# Patient Record
Sex: Male | Born: 1940 | Race: White | Hispanic: No | Smoking: Current every day smoker
Health system: Southern US, Community
[De-identification: ages and names within clinical notes are randomized; demographics above are authoritative.]

## PROBLEM LIST (undated history)

## (undated) DIAGNOSIS — D110 Benign neoplasm of parotid gland: Secondary | ICD-10-CM

## (undated) DIAGNOSIS — IMO0001 Reserved for inherently not codable concepts without codable children: Secondary | ICD-10-CM

## (undated) DIAGNOSIS — I219 Acute myocardial infarction, unspecified: Secondary | ICD-10-CM

## (undated) DIAGNOSIS — I2510 Atherosclerotic heart disease of native coronary artery without angina pectoris: Secondary | ICD-10-CM

## (undated) DIAGNOSIS — R911 Solitary pulmonary nodule: Secondary | ICD-10-CM

## (undated) DIAGNOSIS — F17200 Nicotine dependence, unspecified, uncomplicated: Secondary | ICD-10-CM

## (undated) DIAGNOSIS — J449 Chronic obstructive pulmonary disease, unspecified: Secondary | ICD-10-CM

## (undated) DIAGNOSIS — E785 Hyperlipidemia, unspecified: Secondary | ICD-10-CM

## (undated) DIAGNOSIS — C801 Malignant (primary) neoplasm, unspecified: Secondary | ICD-10-CM

## (undated) DIAGNOSIS — I10 Essential (primary) hypertension: Secondary | ICD-10-CM

## (undated) HISTORY — DX: Benign neoplasm of parotid gland: D11.0

## (undated) HISTORY — DX: Acute myocardial infarction, unspecified: I21.9

## (undated) HISTORY — DX: Solitary pulmonary nodule: R91.1

## (undated) HISTORY — DX: Hyperlipidemia, unspecified: E78.5

## (undated) HISTORY — PX: CARDIAC CATHETERIZATION: SHX172

## (undated) HISTORY — PX: OTHER SURGICAL HISTORY: SHX169

## (undated) HISTORY — DX: Essential (primary) hypertension: I10

## (undated) HISTORY — DX: Nicotine dependence, unspecified, uncomplicated: F17.200

## (undated) HISTORY — PX: TONSILLECTOMY: SUR1361

---

## 2012-05-12 ENCOUNTER — Encounter: Payer: Self-pay | Admitting: Family Medicine

## 2012-05-12 ENCOUNTER — Ambulatory Visit (INDEPENDENT_AMBULATORY_CARE_PROVIDER_SITE_OTHER): Payer: Medicare Other | Admitting: Family Medicine

## 2012-05-12 VITALS — BP 146/100 | HR 56 | Temp 98.1°F | Resp 16 | Wt 165.0 lb

## 2012-05-12 DIAGNOSIS — F17200 Nicotine dependence, unspecified, uncomplicated: Secondary | ICD-10-CM | POA: Insufficient documentation

## 2012-05-12 DIAGNOSIS — J441 Chronic obstructive pulmonary disease with (acute) exacerbation: Secondary | ICD-10-CM

## 2012-05-12 DIAGNOSIS — I10 Essential (primary) hypertension: Secondary | ICD-10-CM | POA: Insufficient documentation

## 2012-05-12 DIAGNOSIS — I219 Acute myocardial infarction, unspecified: Secondary | ICD-10-CM | POA: Insufficient documentation

## 2012-05-12 DIAGNOSIS — E785 Hyperlipidemia, unspecified: Secondary | ICD-10-CM | POA: Insufficient documentation

## 2012-05-12 MED ORDER — PREDNISONE 20 MG PO TABS: ORAL_TABLET | ORAL | Status: DC

## 2012-05-12 MED ORDER — AZITHROMYCIN 250 MG PO TABS: ORAL_TABLET | ORAL | Status: DC

## 2012-05-12 MED ORDER — ALBUTEROL SULFATE HFA 108 (90 BASE) MCG/ACT IN AERS: 2.0000 | INHALATION_SPRAY | Freq: Four times a day (QID) | RESPIRATORY_TRACT | Status: DC | PRN

## 2012-05-12 NOTE — Progress Notes (Signed)
Subjective:    Patient ID: Chad Clements, male    DOB: 1940/10/21, 72 y.o.   MRN: [id number]  HPI 72 year old male who is new to me as a patient. I reviewed his past medical history which is significant for cardiovascular disease, hyperlipidemia, hypertension. He also has a long extensive history of smoking. He states he has no history of COPD or emphysema. However he has a strong family history of emphysema.  He smoked one pack per day for over 50 years. He reports one week of cough which is worsening, chest congestion, and increasing shortness of breath. He also reports wheezing. He felt like his airways are getting tight. He denies any fever. The cough is productive of yellow sputum. Past Medical History  Diagnosis Date  . MI (myocardial infarction) 1992, 1998    has 2 stents (1992, 1998) Reooclusion 2010. Dr. Daryel November  . Hyperlipidemia   . Hypertension   . Smoker    Current outpatient prescriptions:albuterol (PROVENTIL HFA;VENTOLIN HFA) 108 (90 BASE) MCG/ACT inhaler, Inhale 2 puffs into the lungs every 6 (six) hours as needed for wheezing., Disp: 1 Inhaler, Rfl: 0;  azithromycin (ZITHROMAX) 250 MG tablet, 2 tabs poqday 1, 1 tab poqday 2-5, Disp: 6 tablet, Rfl: 0;  carvedilol (COREG) 6.25 MG tablet, Take 6.25 mg by mouth 2 (two) times daily with a meal., Disp: , Rfl:  clopidogrel (PLAVIX) 75 MG tablet, Take 75 mg by mouth daily., Disp: , Rfl: ;  lisinopril (PRINIVIL,ZESTRIL) 20 MG tablet, Take 20 mg by mouth daily., Disp: , Rfl: ;  predniSONE (DELTASONE) 20 MG tablet, 3 tabs poqday 1-2, 2 tabs poqday 3-4, 1 tab poqday 5-6, Disp: 12 tablet, Rfl: 0;  simvastatin (ZOCOR) 40 MG tablet, Take 40 mg by mouth every evening., Disp: , Rfl:    No Known Allergies History   Social History  . Marital Status: Married    Spouse Name: N/A    Number of Children: N/A  . Years of Education: N/A   Occupational History  . Not on file.   Social History Main Topics  . Smoking status: Current Every  Day Smoker -- 1.00 packs/day  . Smokeless tobacco: Not on file  . Alcohol Use: Not on file  . Drug Use: Not on file  . Sexually Active: Not on file   Other Topics Concern  . Not on file   Social History Narrative  . No narrative on file   Family History  Problem Relation Age of Onset  . Heart disease Mother   . COPD Father   . Heart disease Sister   . Mental illness Brother   . Heart disease Sister     Review of Systems  All other systems reviewed and are negative.       Objective:   Physical Exam  Constitutional: He appears well-developed and well-nourished.  HENT:  Right Ear: External ear normal.  Left Ear: External ear normal.  Nose: Nose normal.  Mouth/Throat: Oropharynx is clear and moist.  Neck: Normal range of motion.  Cardiovascular: Normal rate, regular rhythm and normal heart sounds.  Exam reveals no friction rub.   No murmur heard. Pulmonary/Chest: Effort normal. No respiratory distress. He has wheezes. He has no rales.  Abdominal: Soft. Bowel sounds are normal.  Lymphadenopathy:    He has no cervical adenopathy.          Assessment & Plan:   1. COPD exacerbation The patient likely has a COPD exacerbation. He has no documented history of  COPD. Having his wheezing profusely on exam today. Therefore, start Zithromax 500 mg by mouth daily one, 250 mg by mouth q. days 2 through 5. Start prednisone 20 mg tablets. He is to take 3 tablets on days 1 and 2, 2 tablets on day 3 and day 4, one tablet on day 5 and a 6.  Albuterol 2 puffs inhaled every 6 hours when necessary wheezing or shortness of breath. His followup later this week if he is no better sooner he is getting worse. Strongly recommended smoking cessation.

## 2014-01-17 ENCOUNTER — Ambulatory Visit (INDEPENDENT_AMBULATORY_CARE_PROVIDER_SITE_OTHER): Payer: BLUE CROSS/BLUE SHIELD | Admitting: Family Medicine

## 2014-01-17 ENCOUNTER — Encounter: Payer: Self-pay | Admitting: Family Medicine

## 2014-01-17 VITALS — BP 132/70 | HR 78 | Temp 97.5°F | Resp 14 | Ht 71.0 in | Wt 170.0 lb

## 2014-01-17 DIAGNOSIS — R3989 Other symptoms and signs involving the genitourinary system: Secondary | ICD-10-CM | POA: Diagnosis not present

## 2014-01-17 DIAGNOSIS — Z0000 Encounter for general adult medical examination without abnormal findings: Secondary | ICD-10-CM | POA: Diagnosis not present

## 2014-01-17 DIAGNOSIS — R221 Localized swelling, mass and lump, neck: Secondary | ICD-10-CM | POA: Diagnosis not present

## 2014-01-17 DIAGNOSIS — Z23 Encounter for immunization: Secondary | ICD-10-CM

## 2014-01-17 NOTE — Progress Notes (Signed)
Subjective:    Patient ID: Chad Clements, male    DOB: [birth year], 74 y.o.   MRN: [id number]  HPI  Patient is here for CPE.  PMH is significant for CAD s/p MI.  Unfortunately the patient continues to smoke. There is also a mass located at the angle of his right mandible that has been there for several years of him. Inconsistency it feels like a sebaceous cyst however I cannot determine if it is attached to the parotid gland. It may also be a lymph node. It is firm in texture. Patient states it is growing. He also reports occasional bright red blood per rectum. On rectal exam he has an external hemorrhoid and also a palpable internal hemorrhoid. However ever, there seems to be a nodule on the right side of his prostate gland Past Medical History  Diagnosis Date  . MI (myocardial infarction) 1992, 1998    has 2 stents (1992, 1998) Reooclusion 2010. Dr. Orpah Greek  . Hyperlipidemia   . Hypertension   . Smoker    No past surgical history on file. Current Outpatient Prescriptions on File Prior to Visit  Medication Sig Dispense Refill  . carvedilol (COREG) 6.25 MG tablet Take 6.25 mg by mouth 2 (two) times daily with a meal.    . clopidogrel (PLAVIX) 75 MG tablet Take 75 mg by mouth daily.    Marland Kitchen lisinopril (PRINIVIL,ZESTRIL) 20 MG tablet Take 20 mg by mouth daily.    . simvastatin (ZOCOR) 40 MG tablet Take 40 mg by mouth every evening.     No current facility-administered medications on file prior to visit.   No Known Allergies History   Social History  . Marital Status: Married    Spouse Name: N/A    Number of Children: N/A  . Years of Education: N/A   Occupational History  . Not on file.   Social History Main Topics  . Smoking status: Current Every Day Smoker -- 1.00 packs/day  . Smokeless tobacco: Not on file  . Alcohol Use: Not on file  . Drug Use: Not on file  . Sexual Activity: Not on file   Other Topics Concern  . Not on file   Social History Narrative   Family  History  Problem Relation Age of Onset  . Heart disease Mother   . COPD Father   . Heart disease Sister   . Mental illness Brother   . Heart disease Sister      Review of Systems  All other systems reviewed and are negative.      Objective:   Physical Exam  Constitutional: He is oriented to person, place, and time. He appears well-developed and well-nourished. No distress.  HENT:  Head: Normocephalic and atraumatic.  Right Ear: External ear normal.  Left Ear: External ear normal.  Nose: Nose normal.  Mouth/Throat: Oropharynx is clear and moist. No oropharyngeal exudate.  Eyes: Conjunctivae and EOM are normal. Pupils are equal, round, and reactive to light. Right eye exhibits no discharge. Left eye exhibits no discharge. No scleral icterus.  Neck: Normal range of motion. Neck supple. No JVD present. No tracheal deviation present. No thyromegaly present.  Cardiovascular: Normal rate, regular rhythm and intact distal pulses.  Exam reveals no gallop and no friction rub.   No murmur heard. Pulmonary/Chest: Effort normal and breath sounds normal. No stridor. No respiratory distress. He has no wheezes. He has no rales. He exhibits no tenderness.  Abdominal: Soft. Bowel sounds are normal. He exhibits  no distension and no mass. There is no tenderness. There is no rebound and no guarding.  Genitourinary: Rectal exam shows external hemorrhoid. Prostate is enlarged.  Musculoskeletal: Normal range of motion. He exhibits no edema or tenderness.  Lymphadenopathy:    He has no cervical adenopathy.  Neurological: He is alert and oriented to person, place, and time. He has normal reflexes. He displays normal reflexes. No cranial nerve deficit. He exhibits normal muscle tone. Coordination normal.  Skin: Skin is warm. No rash noted. He is not diaphoretic. No erythema. No pallor.  Psychiatric: He has a normal mood and affect. His behavior is normal. Judgment and thought content normal.  Vitals  reviewed.         Assessment & Plan:  Routine general medical examination at a health care facility - Plan: COMPLETE METABOLIC PANEL WITH GFR, CBC with Differential, Lipid panel, PSA, Medicare  Mass of right side of neck - Plan: CT Soft Tissue Neck W Contrast  Abnormal prostate exam - Plan: PSA, Medicare  Patient's physical exam is significant for a mass in the right side of his neck. I believe this is likely a sebaceous cyst however I will obtain a CT scan of the neck to confirm that this is in fact a cyst given its location. Given his smoking history is also possible this could be a parotid gland cancer or possibly an enlarged lymph node like the patient to return as soon as possible fasting so I can check a CBC, CMP, fasting lipid panel, and PSA. Given his slightly abnormal prostate exam if his PSA is elevated greater than 4, I would recommend referral to urology. Patient received his Pneumovax 23 and his flu shot today. His colonoscopy was performed 4 years ago.

## 2014-01-17 NOTE — Addendum Note (Signed)
Addended by: Shary Decamp B on: 01/17/2014 09:34 AM   Modules accepted: Orders

## 2014-01-20 ENCOUNTER — Inpatient Hospital Stay: Admission: RE | Admit: 2014-01-20 | Payer: Self-pay | Source: Ambulatory Visit

## 2014-01-24 ENCOUNTER — Other Ambulatory Visit: Payer: Medicare Other

## 2014-01-24 LAB — COMPLETE METABOLIC PANEL WITH GFR
ALBUMIN: 4 g/dL (ref 3.5–5.2)
ALT: 14 U/L (ref 0–53)
AST: 19 U/L (ref 0–37)
Alkaline Phosphatase: 65 U/L (ref 39–117)
BILIRUBIN TOTAL: 0.3 mg/dL (ref 0.2–1.2)
BUN: 18 mg/dL (ref 6–23)
CALCIUM: 9.1 mg/dL (ref 8.4–10.5)
CO2: 27 meq/L (ref 19–32)
CREATININE: 1.27 mg/dL (ref 0.50–1.35)
Chloride: 101 mEq/L (ref 96–112)
GFR, EST AFRICAN AMERICAN: 65 mL/min
GFR, Est Non African American: 56 mL/min — ABNORMAL LOW
Glucose, Bld: 99 mg/dL (ref 70–99)
POTASSIUM: 4.7 meq/L (ref 3.5–5.3)
Sodium: 136 mEq/L (ref 135–145)
TOTAL PROTEIN: 7.3 g/dL (ref 6.0–8.3)

## 2014-01-24 LAB — LIPID PANEL
Cholesterol: 148 mg/dL (ref 0–200)
HDL: 41 mg/dL (ref >39)
LDL CALC: 84 mg/dL (ref 0–99)
TRIGLYCERIDES: 113 mg/dL (ref <150)
Total CHOL/HDL Ratio: 3.6 Ratio
VLDL: 23 mg/dL (ref 0–40)

## 2014-01-25 LAB — CBC WITH DIFFERENTIAL/PLATELET
BASOS PCT: 1 % (ref 0–1)
Basophils Absolute: 0.1 10*3/uL (ref 0.0–0.1)
EOS ABS: 0.3 10*3/uL (ref 0.0–0.7)
EOS PCT: 5 % (ref 0–5)
HEMATOCRIT: 37.9 % — AB (ref 39.0–52.0)
HEMOGLOBIN: 12.8 g/dL — AB (ref 13.0–17.0)
LYMPHS ABS: 2.1 10*3/uL (ref 0.7–4.0)
Lymphocytes Relative: 33 % (ref 12–46)
MCH: 29 pg (ref 26.0–34.0)
MCHC: 33.8 g/dL (ref 30.0–36.0)
MCV: 85.9 fL (ref 78.0–100.0)
MONO ABS: 0.8 10*3/uL (ref 0.1–1.0)
MPV: 8.6 fL (ref 8.6–12.4)
Monocytes Relative: 13 % — ABNORMAL HIGH (ref 3–12)
NEUTROS ABS: 3 10*3/uL (ref 1.7–7.7)
NEUTROS PCT: 48 % (ref 43–77)
Platelets: 254 10*3/uL (ref 150–400)
RBC: 4.41 MIL/uL (ref 4.22–5.81)
RDW: 14.9 % (ref 11.5–15.5)
WBC: 6.3 10*3/uL (ref 4.0–10.5)

## 2014-01-25 LAB — PSA, MEDICARE: PSA: 2.7 ng/mL (ref <=4.00)

## 2014-01-27 ENCOUNTER — Other Ambulatory Visit: Payer: Self-pay

## 2014-02-03 ENCOUNTER — Ambulatory Visit
Admission: RE | Admit: 2014-02-03 | Discharge: 2014-02-03 | Disposition: A | Discharge status: Home / Self Care | Payer: Medicare Other | Source: Ambulatory Visit | Attending: Family Medicine | Admitting: Family Medicine

## 2014-02-03 DIAGNOSIS — R221 Localized swelling, mass and lump, neck: Secondary | ICD-10-CM

## 2014-02-03 MED ORDER — IOHEXOL 300 MG/ML  SOLN
75.0000 mL | Freq: Once | INTRAMUSCULAR | Status: AC | PRN
  Administered 2014-02-03: 75 mL via INTRAVENOUS

## 2014-02-15 ENCOUNTER — Telehealth: Payer: Self-pay | Admitting: Family Medicine

## 2014-02-15 DIAGNOSIS — R221 Localized swelling, mass and lump, neck: Secondary | ICD-10-CM

## 2014-02-15 NOTE — Telephone Encounter (Signed)
Finally able to reach patient.  Aware of CT report and general surgery ref initiated

## 2014-02-15 NOTE — Telephone Encounter (Signed)
-----   Message from Susy Frizzle, MD sent at 02/03/2014  1:33 PM EST ----- I would recommend an excisional biopsy of this mass with ENT or general surgery.

## 2015-07-24 DIAGNOSIS — K118 Other diseases of salivary glands: Secondary | ICD-10-CM | POA: Diagnosis present

## 2015-08-10 ENCOUNTER — Other Ambulatory Visit: Payer: Self-pay | Admitting: Otolaryngology

## 2015-08-13 ENCOUNTER — Encounter (HOSPITAL_COMMUNITY): Payer: Self-pay

## 2015-08-14 ENCOUNTER — Encounter (HOSPITAL_COMMUNITY): Payer: Self-pay

## 2015-08-14 ENCOUNTER — Encounter (HOSPITAL_COMMUNITY)
Admission: RE | Admit: 2015-08-14 | Discharge: 2015-08-14 | Disposition: A | Discharge status: Home / Self Care | Payer: Medicare Other | Source: Ambulatory Visit | Attending: Otolaryngology | Admitting: Otolaryngology

## 2015-08-14 ENCOUNTER — Other Ambulatory Visit (HOSPITAL_COMMUNITY): Payer: Self-pay | Admitting: *Deleted

## 2015-08-14 DIAGNOSIS — Z79899 Other long term (current) drug therapy: Secondary | ICD-10-CM | POA: Diagnosis not present

## 2015-08-14 DIAGNOSIS — I2510 Atherosclerotic heart disease of native coronary artery without angina pectoris: Secondary | ICD-10-CM | POA: Diagnosis not present

## 2015-08-14 DIAGNOSIS — I252 Old myocardial infarction: Secondary | ICD-10-CM | POA: Diagnosis not present

## 2015-08-14 DIAGNOSIS — Z01812 Encounter for preprocedural laboratory examination: Secondary | ICD-10-CM | POA: Insufficient documentation

## 2015-08-14 DIAGNOSIS — Z01818 Encounter for other preprocedural examination: Secondary | ICD-10-CM | POA: Insufficient documentation

## 2015-08-14 DIAGNOSIS — I10 Essential (primary) hypertension: Secondary | ICD-10-CM | POA: Insufficient documentation

## 2015-08-14 DIAGNOSIS — F17200 Nicotine dependence, unspecified, uncomplicated: Secondary | ICD-10-CM | POA: Insufficient documentation

## 2015-08-14 DIAGNOSIS — Z955 Presence of coronary angioplasty implant and graft: Secondary | ICD-10-CM | POA: Insufficient documentation

## 2015-08-14 DIAGNOSIS — Z7902 Long term (current) use of antithrombotics/antiplatelets: Secondary | ICD-10-CM | POA: Insufficient documentation

## 2015-08-14 DIAGNOSIS — E785 Hyperlipidemia, unspecified: Secondary | ICD-10-CM | POA: Insufficient documentation

## 2015-08-14 HISTORY — DX: Reserved for inherently not codable concepts without codable children: IMO0001

## 2015-08-14 HISTORY — DX: Atherosclerotic heart disease of native coronary artery without angina pectoris: I25.10

## 2015-08-14 LAB — BASIC METABOLIC PANEL
ANION GAP: 7 (ref 5–15)
BUN: 13 mg/dL (ref 6–20)
CALCIUM: 9.1 mg/dL (ref 8.9–10.3)
CO2: 26 mmol/L (ref 22–32)
Chloride: 106 mmol/L (ref 101–111)
Creatinine, Ser: 1.13 mg/dL (ref 0.61–1.24)
GLUCOSE: 108 mg/dL — AB (ref 65–99)
POTASSIUM: 3.8 mmol/L (ref 3.5–5.1)
SODIUM: 139 mmol/L (ref 135–145)

## 2015-08-14 LAB — CBC
HEMATOCRIT: 37.4 % — AB (ref 39.0–52.0)
HEMOGLOBIN: 12 g/dL — AB (ref 13.0–17.0)
MCH: 28.4 pg (ref 26.0–34.0)
MCHC: 32.1 g/dL (ref 30.0–36.0)
MCV: 88.4 fL (ref 78.0–100.0)
Platelets: 185 10*3/uL (ref 150–400)
RBC: 4.23 MIL/uL (ref 4.22–5.81)
RDW: 14.6 % (ref 11.5–15.5)
WBC: 4.8 10*3/uL (ref 4.0–10.5)

## 2015-08-14 NOTE — Progress Notes (Addendum)
PCP - Cletus Gash [facility] [place]  Chest x-ray - not needed EKG - completed last week with cardiologist - requested  Stress Test - October 2016 - in [place], requested ECHO - last week - [place] requesting Cardiac Cath - in care everywhere 2010  Patient stated that he has been cleared for surgery by his cardiologist and forgot to bring the paperwork to [place] for that.  Requested from Dr Sanjuan Dame office [phone]  Patient was instructed to stop Plavix 5 days prior to surgery   Patient denies shortness of breath, fever, cough and chest pain at PAT appointment

## 2015-08-14 NOTE — Pre-Procedure Instructions (Signed)
Chad Clements  08/14/2015      [facility] [place] [facility] [address] [place] [address] Phone: [phone] Fax: [phone]    Your procedure is scheduled on Wednesday August 16  Report to [facility] at [employer] A.M.  Call this number if you have problems the morning of surgery:  [phone]   Remember:  Do not eat food or drink liquids after midnight.   Take these medicines the morning of surgery with A SIP OF WATER carvedilol (coreg)  7 days prior to surgery STOP taking any Aspirin, Aleve, Naproxen, Ibuprofen, Motrin, Advil, Goody's, BC's, all herbal medications, fish oil, and all vitamins    Do not wear jewelry.  Do not wear lotions, powders, or colonge.  You may NOT wear deoderant.  Men may shave face and neck.  Do not bring valuables to the hospital.  [facility] is not responsible for any belongings or valuables.  Contacts, dentures or bridgework may not be worn into surgery.  Leave your suitcase in the car.  After surgery it may be brought to your room.  For patients admitted to the hospital, discharge time will be determined by your treatment team.  Patients discharged the day of surgery will not be allowed to drive home.    Special instructions:   [facility]- Preparing For Surgery  Before surgery, you can play an important role. Because skin is not sterile, your skin needs to be as free of germs as possible. You can reduce the number of germs on your skin by washing with CHG (chlorahexidine gluconate) Soap before surgery.  CHG is an antiseptic cleaner which kills germs and bonds with the skin to continue killing germs even after washing.  Please do not use if you have an allergy to CHG or antibacterial soaps. If your skin becomes reddened/irritated stop using the CHG.  Do not shave (including legs and underarms) for at least 48 hours prior to first CHG shower. It is OK to shave your face.  Please  follow these instructions carefully.   1. Shower the NIGHT BEFORE SURGERY and the MORNING OF SURGERY with CHG.   2. If you chose to wash your hair, wash your hair first as usual with your normal shampoo.  3. After you shampoo, rinse your hair and body thoroughly to remove the shampoo.  4. Use CHG as you would any other liquid soap. You can apply CHG directly to the skin and wash gently with a scrungie or a clean washcloth.   5. Apply the CHG Soap to your body ONLY FROM THE NECK DOWN.  Do not use on open wounds or open sores. Avoid contact with your eyes, ears, mouth and genitals (private parts). Wash genitals (private parts) with your normal soap.  6. Wash thoroughly, paying special attention to the area where your surgery will be performed.  7. Thoroughly rinse your body with warm water from the neck down.  8. DO NOT shower/wash with your normal soap after using and rinsing off the CHG Soap.  9. Pat yourself dry with a CLEAN TOWEL.   10. Wear CLEAN PAJAMAS   11. Place CLEAN SHEETS on your bed the night of your first shower and DO NOT SLEEP WITH PETS.    Day of Surgery: Do not apply any deodorants/lotions. Please wear clean clothes to the hospital/surgery center.      Please read over the following fact sheets that you were given.  Pain Booklet, Coughing and Deep Breathing and Surgical Site Infection Prevention

## 2015-08-15 ENCOUNTER — Other Ambulatory Visit: Payer: Self-pay | Admitting: Otolaryngology

## 2015-08-15 ENCOUNTER — Other Ambulatory Visit (HOSPITAL_COMMUNITY): Payer: Medicare Other

## 2015-08-15 NOTE — Progress Notes (Addendum)
Anesthesia Chart Review:  Pt is a 75 year old Clements scheduled for R parotidectomy on 08/22/2015 with Jerrell Belfast, MD.   PCP is Jenna Luo, MD.  Cardiologist is Orpah Greek, MD who has cleared pt for surgery.   PMH includes:  CAD (MI and stents in 1992, 1998; BMS to OM3 2010), HTN, hyperlipidemia. Current smoker. BMI 22  Medications include: carvedilol, plavix, lisinopril, simvastatin. Pt to stop plavix 5 days before surgery.   Preoperative labs reviewed.    EKG 08/09/15: sinus rhythm. ST & T wave abnormality, possible lateral and inferior ischemia.   Echo 08/13/15:  1. LV and RV are dilated 2. EF 55-60% 3. Grade 1 diastolic dysfunction 4. Mild MR, TR and PI  Nuclear stress test 10/31/14:  1. Inferior basilar hypokinesia 2. Nor reversible ischemic perfusion scan 3. Low risk scan 4. Mostly fixed inferior defect with mild lateral reversibility.   Cardiac cath 06/28/08 (Care everywhere):  Branch Stenosis  Lesion Type ------------------------ --------------  ----------- Right Coronary Artery Prox RCA40%extending Diffuse  to Dist RCA Mid RCA 60%   Discrete RPL1(Small) RPL2(Small) RPL3(Small) Left Circumflex Prox LCX30%  Discrete Mid LCX  30%   Diffuse Dist LCX(Small) OM1    30%   Diffuse OM2(Small) *OM3(Large) 99%   Diffuse Ram Int(Small) Left Anterior Descending Mid LAD 40%   Discrete D1(Small) D2(Large) 40%   Diffuse Left Main normal * Denotes significant lesion - BMS was placed in OM3  If no changes, I anticipate pt can proceed with surgery as scheduled.   Willeen Cass, FNP-BC [facility] Short Stay  Surgical Center/Anesthesiology Phone: [phone] 08/16/2015 1:12 PM

## 2015-08-21 MED ORDER — DEXAMETHASONE SODIUM PHOSPHATE 10 MG/ML IJ SOLN
10.0000 mg | Freq: Once | INTRAMUSCULAR | Status: DC
  Filled 2015-08-21: qty 1

## 2015-08-21 MED ORDER — CEFAZOLIN SODIUM-DEXTROSE 2-4 GM/100ML-% IV SOLN
2.0000 g | INTRAVENOUS | Status: AC
  Administered 2015-08-22: 2 g via INTRAVENOUS
  Filled 2015-08-21: qty 100

## 2015-08-22 ENCOUNTER — Ambulatory Visit (HOSPITAL_COMMUNITY)
Admission: RE | Admit: 2015-08-22 | Discharge: 2015-08-23 | Disposition: A | Discharge status: Home / Self Care | Payer: Medicare Other | Source: Ambulatory Visit | Attending: Otolaryngology | Admitting: Otolaryngology

## 2015-08-22 ENCOUNTER — Encounter (HOSPITAL_COMMUNITY): Payer: Self-pay | Admitting: Certified Registered"

## 2015-08-22 ENCOUNTER — Ambulatory Visit (HOSPITAL_COMMUNITY): Payer: Medicare Other | Admitting: Emergency Medicine

## 2015-08-22 ENCOUNTER — Encounter (HOSPITAL_COMMUNITY)
Admission: RE | Disposition: A | Discharge status: Home / Self Care | Payer: Self-pay | Source: Ambulatory Visit | Attending: Otolaryngology

## 2015-08-22 ENCOUNTER — Ambulatory Visit (HOSPITAL_COMMUNITY): Payer: Medicare Other | Admitting: Certified Registered"

## 2015-08-22 DIAGNOSIS — Y838 Other surgical procedures as the cause of abnormal reaction of the patient, or of later complication, without mention of misadventure at the time of the procedure: Secondary | ICD-10-CM | POA: Diagnosis not present

## 2015-08-22 DIAGNOSIS — I10 Essential (primary) hypertension: Secondary | ICD-10-CM | POA: Diagnosis not present

## 2015-08-22 DIAGNOSIS — F17200 Nicotine dependence, unspecified, uncomplicated: Secondary | ICD-10-CM | POA: Insufficient documentation

## 2015-08-22 DIAGNOSIS — I252 Old myocardial infarction: Secondary | ICD-10-CM | POA: Diagnosis not present

## 2015-08-22 DIAGNOSIS — E785 Hyperlipidemia, unspecified: Secondary | ICD-10-CM | POA: Diagnosis not present

## 2015-08-22 DIAGNOSIS — I2510 Atherosclerotic heart disease of native coronary artery without angina pectoris: Secondary | ICD-10-CM | POA: Diagnosis not present

## 2015-08-22 DIAGNOSIS — K91870 Postprocedural hematoma of a digestive system organ or structure following a digestive system procedure: Secondary | ICD-10-CM | POA: Diagnosis not present

## 2015-08-22 DIAGNOSIS — R0602 Shortness of breath: Secondary | ICD-10-CM | POA: Insufficient documentation

## 2015-08-22 DIAGNOSIS — K118 Other diseases of salivary glands: Secondary | ICD-10-CM | POA: Diagnosis present

## 2015-08-22 DIAGNOSIS — D110 Benign neoplasm of parotid gland: Secondary | ICD-10-CM | POA: Diagnosis present

## 2015-08-22 HISTORY — PX: PAROTIDECTOMY: SHX2163

## 2015-08-22 HISTORY — PX: HEMATOMA EVACUATION: SHX5118

## 2015-08-22 SURGERY — EXCISION, PAROTID GLAND
Anesthesia: General | Laterality: Right

## 2015-08-22 MED ORDER — EPHEDRINE SULFATE 50 MG/ML IJ SOLN
INTRAMUSCULAR | Status: DC | PRN
  Administered 2015-08-22 (×2): 10 mg via INTRAVENOUS
  Administered 2015-08-22 (×2): 5 mg via INTRAVENOUS

## 2015-08-22 MED ORDER — CHLORHEXIDINE GLUCONATE CLOTH 2 % EX PADS: 6.0000 | MEDICATED_PAD | Freq: Once | CUTANEOUS | Status: DC

## 2015-08-22 MED ORDER — PROPOFOL 10 MG/ML IV BOLUS
INTRAVENOUS | Status: DC | PRN
  Administered 2015-08-22: 100 mg via INTRAVENOUS
  Administered 2015-08-22: 150 mg via INTRAVENOUS

## 2015-08-22 MED ORDER — HYDROCODONE-ACETAMINOPHEN 5-325 MG PO TABS: 1.0000 | ORAL_TABLET | Freq: Four times a day (QID) | ORAL | 0 refills | Status: AC | PRN

## 2015-08-22 MED ORDER — FENTANYL CITRATE (PF) 100 MCG/2ML IJ SOLN
INTRAMUSCULAR | Status: AC
  Filled 2015-08-22: qty 2

## 2015-08-22 MED ORDER — SUCCINYLCHOLINE CHLORIDE 20 MG/ML IJ SOLN
INTRAMUSCULAR | Status: DC | PRN
  Administered 2015-08-22: 100 mg via INTRAVENOUS

## 2015-08-22 MED ORDER — ACETAMINOPHEN 650 MG RE SUPP
650.0000 mg | RECTAL | Status: DC | PRN
Start: 2015-08-22 — End: 2015-08-23

## 2015-08-22 MED ORDER — LACTATED RINGERS IV SOLN
INTRAVENOUS | Status: DC
  Administered 2015-08-22: 12:00:00 via INTRAVENOUS
  Administered 2015-08-22: 50 mL/h via INTRAVENOUS

## 2015-08-22 MED ORDER — ONDANSETRON HCL 4 MG PO TABS: 4.0000 mg | ORAL_TABLET | ORAL | Status: DC | PRN

## 2015-08-22 MED ORDER — ONDANSETRON HCL 4 MG/2ML IJ SOLN: 4.0000 mg | INTRAMUSCULAR | Status: DC | PRN

## 2015-08-22 MED ORDER — LIDOCAINE-EPINEPHRINE 1 %-1:100000 IJ SOLN
INTRAMUSCULAR | Status: DC | PRN
  Administered 2015-08-22: 3 mL

## 2015-08-22 MED ORDER — HYDROCODONE-ACETAMINOPHEN 5-325 MG PO TABS
1.0000 | ORAL_TABLET | ORAL | Status: DC | PRN
  Administered 2015-08-22: 1 via ORAL
  Filled 2015-08-22: qty 1

## 2015-08-22 MED ORDER — OXYCODONE HCL 5 MG/5ML PO SOLN: 5.0000 mg | Freq: Once | ORAL | Status: DC | PRN

## 2015-08-22 MED ORDER — SIMVASTATIN 40 MG PO TABS
40.0000 mg | ORAL_TABLET | Freq: Every evening | ORAL | Status: DC
  Administered 2015-08-22: 40 mg via ORAL
  Filled 2015-08-22: qty 1

## 2015-08-22 MED ORDER — MORPHINE SULFATE (PF) 2 MG/ML IV SOLN: 2.0000 mg | INTRAVENOUS | Status: DC | PRN

## 2015-08-22 MED ORDER — FENTANYL CITRATE (PF) 100 MCG/2ML IJ SOLN
25.0000 ug | INTRAMUSCULAR | Status: DC | PRN
  Administered 2015-08-22 (×2): 50 ug via INTRAVENOUS

## 2015-08-22 MED ORDER — OXYCODONE HCL 5 MG PO TABS: 5.0000 mg | ORAL_TABLET | Freq: Once | ORAL | Status: DC | PRN

## 2015-08-22 MED ORDER — ONDANSETRON HCL 4 MG/2ML IJ SOLN
INTRAMUSCULAR | Status: DC | PRN
  Administered 2015-08-22: 4 mg via INTRAVENOUS

## 2015-08-22 MED ORDER — LISINOPRIL 10 MG PO TABS
10.0000 mg | ORAL_TABLET | Freq: Every day | ORAL | Status: DC
  Administered 2015-08-23: 10 mg via ORAL
  Filled 2015-08-22: qty 1

## 2015-08-22 MED ORDER — CARVEDILOL 6.25 MG PO TABS
6.2500 mg | ORAL_TABLET | Freq: Two times a day (BID) | ORAL | Status: DC
  Administered 2015-08-23: 6.25 mg via ORAL
  Filled 2015-08-22 (×2): qty 1

## 2015-08-22 MED ORDER — DEXTROSE IN LACTATED RINGERS 5 % IV SOLN
INTRAVENOUS | Status: DC
  Administered 2015-08-22: 22:00:00 via INTRAVENOUS

## 2015-08-22 MED ORDER — LIDOCAINE HCL (CARDIAC) 20 MG/ML IV SOLN
INTRAVENOUS | Status: DC | PRN
  Administered 2015-08-22: 60 mg via INTRAVENOUS

## 2015-08-22 MED ORDER — PROPOFOL 10 MG/ML IV BOLUS
INTRAVENOUS | Status: AC
  Filled 2015-08-22: qty 20

## 2015-08-22 MED ORDER — FENTANYL CITRATE (PF) 100 MCG/2ML IJ SOLN
INTRAMUSCULAR | Status: DC | PRN
  Administered 2015-08-22: 25 ug via INTRAVENOUS
  Administered 2015-08-22 (×2): 50 ug via INTRAVENOUS

## 2015-08-22 MED ORDER — ACETAMINOPHEN 160 MG/5ML PO SOLN: 650.0000 mg | ORAL | Status: DC | PRN

## 2015-08-22 MED ORDER — LIDOCAINE-EPINEPHRINE 1 %-1:100000 IJ SOLN
INTRAMUSCULAR | Status: AC
Start: 2015-08-22 — End: 2015-08-22
  Filled 2015-08-22: qty 1

## 2015-08-22 MED ORDER — EPHEDRINE SULFATE 50 MG/ML IJ SOLN
INTRAMUSCULAR | Status: AC
  Filled 2015-08-22: qty 1

## 2015-08-22 MED ORDER — 0.9 % SODIUM CHLORIDE (POUR BTL) OPTIME
TOPICAL | Status: DC | PRN
  Administered 2015-08-22 (×2): 1000 mL

## 2015-08-22 MED ORDER — ONDANSETRON HCL 4 MG/2ML IJ SOLN: 4.0000 mg | Freq: Once | INTRAMUSCULAR | Status: DC | PRN

## 2015-08-22 MED ORDER — DEXAMETHASONE SODIUM PHOSPHATE 4 MG/ML IJ SOLN
INTRAMUSCULAR | Status: DC | PRN
  Administered 2015-08-22: 10 mg via INTRAVENOUS

## 2015-08-22 MED ORDER — FENTANYL CITRATE (PF) 100 MCG/2ML IJ SOLN
INTRAMUSCULAR | Status: AC
  Administered 2015-08-22: 50 ug via INTRAVENOUS
  Filled 2015-08-22: qty 2

## 2015-08-22 SURGICAL SUPPLY — 59 items
BLADE SURG 15 STRL LF DISP TIS (BLADE) IMPLANT
BLADE SURG 15 STRL SS (BLADE)
CANISTER SUCTION 2500CC (MISCELLANEOUS) ×4 IMPLANT
CLEANER TIP ELECTROSURG 2X2 (MISCELLANEOUS) ×4 IMPLANT
CONT SPEC 4OZ CLIKSEAL STRL BL (MISCELLANEOUS) IMPLANT
CORDS BIPOLAR (ELECTRODE) ×8 IMPLANT
COVER SURGICAL LIGHT HANDLE (MISCELLANEOUS) ×8 IMPLANT
DERMABOND ADVANCED (GAUZE/BANDAGES/DRESSINGS) ×2
DERMABOND ADVANCED .7 DNX12 (GAUZE/BANDAGES/DRESSINGS) ×2 IMPLANT
DRAIN JACKSON RD 7FR 3/32 (WOUND CARE) ×8 IMPLANT
DRAIN SNY 10 ROU (WOUND CARE) IMPLANT
DRAPE HALF SHEET 40X57 (DRAPES) ×8 IMPLANT
DRAPE PROXIMA HALF (DRAPES) IMPLANT
DRAPE SURG 17X23 STRL (DRAPES) ×4 IMPLANT
DRSG TEGADERM 2-3/8X2-3/4 SM (GAUZE/BANDAGES/DRESSINGS) ×20 IMPLANT
ELECT COATED BLADE 2.86 ST (ELECTRODE) ×8 IMPLANT
ELECT REM PT RETURN 9FT ADLT (ELECTROSURGICAL) ×4
ELECTRODE REM PT RTRN 9FT ADLT (ELECTROSURGICAL) ×2 IMPLANT
EVACUATOR SILICONE 100CC (DRAIN) ×8 IMPLANT
GAUZE SPONGE 4X4 16PLY XRAY LF (GAUZE/BANDAGES/DRESSINGS) ×8 IMPLANT
GLOVE BIOGEL M 7.0 STRL (GLOVE) ×12 IMPLANT
GLOVE BIOGEL PI IND STRL 8 (GLOVE) ×2 IMPLANT
GLOVE BIOGEL PI INDICATOR 8 (GLOVE) ×2
GLOVE ECLIPSE 8.0 STRL XLNG CF (GLOVE) ×4 IMPLANT
GLOVE SURG SS PI 8.0 STRL IVOR (GLOVE) ×4 IMPLANT
GOWN STRL REUS W/ TWL LRG LVL3 (GOWN DISPOSABLE) ×4 IMPLANT
GOWN STRL REUS W/ TWL XL LVL3 (GOWN DISPOSABLE) ×2 IMPLANT
GOWN STRL REUS W/TWL LRG LVL3 (GOWN DISPOSABLE) ×4
GOWN STRL REUS W/TWL XL LVL3 (GOWN DISPOSABLE) ×2
KIT BASIN OR (CUSTOM PROCEDURE TRAY) ×4 IMPLANT
KIT ROOM TURNOVER OR (KITS) ×4 IMPLANT
NEEDLE HYPO 25GX1X1/2 BEV (NEEDLE) IMPLANT
NS IRRIG 1000ML POUR BTL (IV SOLUTION) ×4 IMPLANT
PACK EENT II TURBAN DRAPE (CUSTOM PROCEDURE TRAY) ×4 IMPLANT
PAD ARMBOARD 7.5X6 YLW CONV (MISCELLANEOUS) ×8 IMPLANT
PENCIL BUTTON HOLSTER BLD 10FT (ELECTRODE) ×8 IMPLANT
PROBE NERVBE PRASS .33 (MISCELLANEOUS) ×4 IMPLANT
SHEARS HARMONIC 9CM CVD (BLADE) ×8 IMPLANT
STAPLER VISISTAT 35W (STAPLE) ×8 IMPLANT
SUCTION FRAZIER HANDLE 10FR (MISCELLANEOUS) ×2
SUCTION TUBE FRAZIER 10FR DISP (MISCELLANEOUS) ×2 IMPLANT
SUT ETHILON 3 0 PS 1 (SUTURE) IMPLANT
SUT ETHILON 4 0 PS 2 18 (SUTURE) ×8 IMPLANT
SUT ETHILON 5 0 P 3 18 (SUTURE)
SUT ETHILON 6 0 P 1 (SUTURE) IMPLANT
SUT NYLON ETHILON 5-0 P-3 1X18 (SUTURE) IMPLANT
SUT SILK 2 0 FS (SUTURE) ×4 IMPLANT
SUT SILK 2 0 REEL (SUTURE) ×4 IMPLANT
SUT SILK 2 0 SH (SUTURE) ×4 IMPLANT
SUT SILK 3 0 REEL (SUTURE) ×4 IMPLANT
SUT SILK 3 0 SH 30 (SUTURE) ×8 IMPLANT
SUT VIC AB 5-0 P-3 18XBRD (SUTURE) ×4 IMPLANT
SUT VIC AB 5-0 P3 18 (SUTURE) ×4
SUT VICRYL 4-0 PS2 18IN ABS (SUTURE) ×8 IMPLANT
SYR BULB IRRIGATION 50ML (SYRINGE) ×4 IMPLANT
TOWEL OR 17X24 6PK STRL BLUE (TOWEL DISPOSABLE) ×4 IMPLANT
TRAY ENT MC OR (CUSTOM PROCEDURE TRAY) ×4 IMPLANT
TUBE CONNECTING 12'X1/4 (SUCTIONS) ×1
TUBE CONNECTING 12X1/4 (SUCTIONS) ×3 IMPLANT

## 2015-08-22 NOTE — Anesthesia Procedure Notes (Signed)
Procedure Name: LMA Insertion Date/Time: 08/22/2015 1:26 PM Performed by: Carolyn Maniscalco D Pre-anesthesia Checklist: Patient identified, Emergency Drugs available, Suction available and Patient being monitored Patient Re-evaluated:Patient Re-evaluated prior to inductionOxygen Delivery Method: Circle system utilized Preoxygenation: Pre-oxygenation with 100% oxygen Intubation Type: IV induction Ventilation: Mask ventilation without difficulty LMA: LMA inserted LMA Size: 4.0 Number of attempts: 1 Airway Equipment and Method: Bite block Placement Confirmation: positive ETCO2 Tube secured with: Tape Dental Injury: Teeth and Oropharynx as per pre-operative assessment

## 2015-08-22 NOTE — Brief Op Note (Signed)
08/22/2015  1:19 PM  PATIENT:  Trude Mcburney  75 y.o. male  PRE-OPERATIVE DIAGNOSIS:  PAROTID MASS  POST-OPERATIVE DIAGNOSIS:  PAROTID MASS  PROCEDURE:  Procedure(s): RIGHT PAROTID MASS (Right)  SURGEON:  Surgeon(s) and Role:    * Jerrell Belfast, MD - Primary    * Jodi Marble, MD - Assisting  PHYSICIAN ASSISTANT:   ASSISTANTS: Dr. Jodi Marble   ANESTHESIA:   general  EBL:  Total I/O In: 1500 [I.V.:1500] Out: - 50cc  BLOOD ADMINISTERED:none  DRAINS: 7 Fr round drain in rt cheek  LOCAL MEDICATIONS USED:  LIDOCAINE  and Amount: 3 ml  SPECIMEN:  Source of Specimen:  Right parotid  DISPOSITION OF SPECIMEN:  PATHOLOGY  COUNTS:  YES  TOURNIQUET:  * No tourniquets in log *  DICTATION: .Other Dictation: Dictation Number [id number]  PLAN OF CARE: Admit for overnight observation  PATIENT DISPOSITION:  PACU - hemodynamically stable.   Delay start of Pharmacological VTE agent (>24hrs) due to surgical blood loss or risk of bleeding: yes

## 2015-08-22 NOTE — Anesthesia Preprocedure Evaluation (Addendum)
Anesthesia Evaluation  Patient identified by MRN, date of birth, ID band Patient awake    Reviewed: Allergy & Precautions, NPO status , Patient's Chart, lab work & pertinent test results  Airway Mallampati: II  TM Distance: >3 FB Neck ROM: Full    Dental  (+) Edentulous Upper, Dental Advisory Given   Pulmonary shortness of breath, Current Smoker,    breath sounds clear to auscultation       Cardiovascular hypertension, Pt. on home beta blockers and Pt. on medications + CAD and + Past MI   Rhythm:Regular Rate:Normal     Neuro/Psych    GI/Hepatic   Endo/Other    Renal/GU      Musculoskeletal   Abdominal   Peds  Hematology   Anesthesia Other Findings   Reproductive/Obstetrics                            Anesthesia Physical Anesthesia Plan  ASA: III  Anesthesia Plan: General   Post-op Pain Management:    Induction: Intravenous  Airway Management Planned: Oral ETT  Additional Equipment:   Intra-op Plan:   Post-operative Plan: Extubation in OR  Informed Consent: I have reviewed the patients History and Physical, chart, labs and discussed the procedure including the risks, benefits and alternatives for the proposed anesthesia with the patient or authorized representative who has indicated his/her understanding and acceptance.   Dental advisory given  Plan Discussed with: CRNA and Anesthesiologist  Anesthesia Plan Comments:         Anesthesia Quick Evaluation

## 2015-08-22 NOTE — Anesthesia Procedure Notes (Signed)
Procedure Name: Intubation Date/Time: 08/22/2015 10:51 AM Performed by: Freeda Spivey D Pre-anesthesia Checklist: Patient identified, Emergency Drugs available, Suction available and Patient being monitored Patient Re-evaluated:Patient Re-evaluated prior to inductionOxygen Delivery Method: Circle system utilized Preoxygenation: Pre-oxygenation with 100% oxygen Intubation Type: IV induction Ventilation: Mask ventilation without difficulty Laryngoscope Size: Mac and 3 Grade View: Grade I Tube type: Oral Tube size: 7.0 mm Number of attempts: 1 Airway Equipment and Method: Stylet and Oral airway Placement Confirmation: ETT inserted through vocal cords under direct vision,  positive ETCO2 and breath sounds checked- equal and bilateral Tube secured with: Tape Dental Injury: Teeth and Oropharynx as per pre-operative assessment

## 2015-08-22 NOTE — Op Note (Signed)
Chad Clements, Chad Clements            ACCOUNT NO.:  [id number]  MEDICAL RECORD NO.:  [id number]  LOCATION:  [facility]                         FACILITY:  [facility]  PHYSICIAN:  Early Chars. Wilburn Cornelia, M.D.DATE OF BIRTH:  06/28/1940  DATE OF PROCEDURE:  08/22/2015 DATE OF DISCHARGE:                              OPERATIVE REPORT   LOCATION:  [facility] Main OR.  PREOPERATIVE DIAGNOSIS:  Right parotid mass.  POSTOPERATIVE DIAGNOSIS:  Right parotid mass.  INDICATIONS FOR SURGERY:  Right parotid mass.  PROCEDURE:  Right superficial parotidectomy with NIMS monitoring.  ANESTHESIA:  General endotracheal.  SURGEON:  Early Chars. Wilburn Cornelia, M.D.  ASSISTANT:  Dr. Clerance Lav.  COMPLICATIONS:  No complications.  BLOOD LOSS:  Less than 50 mL.  The patient was transferred from the operating room to the recovery room in stable condition.  BRIEF HISTORY:  The patient is a 75 year old, black male, who was referred to our office and followed with a gradually enlarging mass involving the right parotid gland.  The mass initially noted over 5 years ago.  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  CT scan was obtained, which showed a mass within the posterior aspect of the parotid gland.  I recommended initial parotidectomy at that time.  The patient deferred surgery and we continued to monitor.  The mass was gradually enlarging over time and the patient was re-evaluated with recommendations for right superficial parotidectomy with NIMS monitoring.  The risks and benefits of the procedure were discussed in detail with the patient and his family.  They understood and agreed with our plan for surgery which was scheduled on elective basis at [place].  Prior to surgery, the patient's cardiologist cleared him from a cardiac standpoint and the patient stops anticoagulant therapy 5 days prior to surgery.  DESCRIPTION OF PROCEDURE:   The patient was brought to the operating room on August 22, 2015, placed in supine position on the operating table. General endotracheal anesthesia was established without difficulty. When the patient adequately was anesthetized, he was positioned on the operating table and prepped and draped in a sterile fashion.  The surgical time-out was then performed, correct identification of the patient, and the surgical procedure including the right laterality of the procedure.  The [facility] monitor was then placed with monitoring probes in the right orbicularis auris and orbicularis oculi muscle groups.  The NIMS monitor was activated and tested and was used throughout the surgical procedure during the dissection of the facial nerve.  The patient was then injected with a total of 3 mL of 1% lidocaine with 1:100,000 epinephrine was injected in subcutaneous fashion along the proposed skin incision.  The patient then underwent final prep and drape, and surgical procedure was begun.  With the patient prepped, draped and prepared for surgery, a curvilinear incision was created in the right anterior preauricular crease.  This was extended around the earlobe and into the upper neck.  This carried through the skin underlying subcutaneous tissue using a 15 scalpel. Using Bovie electrocautery, dissection was then carried out in the deep subcutaneous layer.  The layer of the facial muscles and periparotid  fascia was then identified.  The skin and overlying the facial musculature was elevated anteriorly as a separate flap and the parotid fascia and parotid gland itself were left in the deep aspect of the surgical exploration.  The posterior margin of the parotid gland was then dissected beginning in the preauricular sulcus and extending from superficial to deep.  The anterior border of the sternocleidomastoid muscle was identified and dissection was carried out from superior to inferior elevating the  parotid gland and preserving the muscle. Posterior belly of the digastric muscle was identified in the inferior deep aspect of the dissection.  The parotid gland was elevated superiorly and anteriorly creating the posterior margin of our dissection.  The patient's auricular tragus was then followed from superficial to deep.  The posterior middle attachment of the parotid gland was divided using Bovie electrocautery as well as the Harmonic Scalpel.  The common trunk of the facial nerve was identified at the depth of the incision and was then followed with blunt and sharp dissection to the superior and inferior divisions of the facial nerve. The mass which was clearly visualized and palpable within the posterior and inferior aspect of the parotid gland.  Dissection was then carried out along the entire lower division of the gland using blunt dissection and Harmonic Scalpel.  The nerve branches were preserved throughout its course.  The common facial vein was identified, divided, and suture ligated, and it was reflected anteriorly along with the parotid gland. The superior division of the facial nerve was identified and then traced from proximal to distal elevating the overlying gland and removing the superficial component of the parotid gland with the concluded tumor. The tissue was then sent to Pathology for gross microscopic evaluation. The patient's wound was thoroughly irrigated with saline.  Each branch of the facial nerve was then stimulated using the NIMS monitor.  All branches were intact and stimulated appropriately.  The patient's incision was then closed after placing a 7-French round drain at the depth of the incision and carried out through a posterior skin incision and sutured with a 3-0 Ethilon suture.  The wound was closed with interrupted 4-0 and 5-0 Vicryl sutures in the deep subcutaneous tissue and immediate subcutaneous tissue.  The incision was then closed  with Dermabond surgical glue.  The patient was awakened from his anesthetic. He was extubated and then transferred from the operating room to recovery room in stable condition.  There were no complications. Estimated blood loss was 50 mL.          ______________________________ Early Chars. Wilburn Cornelia, M.D.     DLS/MEDQ  D:  [id number]  T:  08/22/2015  Job:  [id number]

## 2015-08-22 NOTE — H&P (Signed)
Chad Clements is an 75 y.o. male.   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  Past Medical History:  Diagnosis Date  . Coronary artery disease   . Hyperlipidemia   . Hypertension   . MI (myocardial infarction) ([place]) 1992, [place]   has 2 stents (1992, 1998) Reooclusion 2010. Dr. Orpah Greek  . Shortness of breath dyspnea   . Smoker     Past Surgical History:  Procedure Laterality Date  . CARDIAC CATHETERIZATION    . TONSILLECTOMY      Family History  Problem Relation Age of Onset  . Mental illness Brother   . Heart disease Mother   . COPD Father   . Heart disease Sister   . Heart disease Sister    Social History:  reports that he has been smoking.  He has been smoking about 0.50 packs per day. He has never used smokeless tobacco. He reports that he does not drink alcohol or use drugs.  Allergies:  Allergies  Allergen Reactions  . No Known Allergies     Medications Prior to Admission  Medication Sig Dispense Refill  . carvedilol (COREG) 6.25 MG tablet Take 6.25 mg by mouth 2 (two) times daily with a meal.    . clopidogrel (PLAVIX) 75 MG tablet Take 75 mg by mouth daily.    Marland Kitchen lisinopril (PRINIVIL,ZESTRIL) 10 MG tablet Take 10 mg by mouth daily.     . simvastatin (ZOCOR) 40 MG tablet Take 40 mg by mouth every evening.      No results found for this or any previous visit (from the past 48 hour(s)). No results found.  Review of Systems  Constitutional: Negative.   HENT: Negative.   Respiratory: Negative.   Cardiovascular: Negative.     Blood pressure (!) 163/83, pulse (!) 58, temperature 98.1 F (36.7 C), temperature source Oral, resp. rate 18, height 5' 10.5" (1.791 m), weight 69.9 kg (154 lb 1.6 oz), SpO2 100 %. Physical Exam  Constitutional: He appears well-developed and well-nourished.  Neck: Normal range of motion. Neck supple.  Right parotid mass, nl FN func  Cardiovascular: Normal rate.    Respiratory: Effort normal.  GI: Soft.     Assessment/Plan Adm for Rt parotidectomy with NIMS.  Rida Loudin, MD 08/22/2015, 10:25 AM

## 2015-08-22 NOTE — Anesthesia Postprocedure Evaluation (Signed)
Anesthesia Post Note  Patient: Chad Clements  Procedure(s) Performed: Procedure(s) (LRB): RIGHT PAROTID MASS (Right) EVACUATION HEMATOMA  Patient location during evaluation: PACU Anesthesia Type: General Level of consciousness: awake, awake and alert and oriented Pain management: pain level controlled Vital Signs Assessment: post-procedure vital signs reviewed and stable Respiratory status: spontaneous breathing, nonlabored ventilation and respiratory function stable Cardiovascular status: blood pressure returned to baseline Anesthetic complications: no    Last Vitals:  Vitals:   08/22/15 1600 08/22/15 1615  BP: (!) 164/131 (!) 150/89  Pulse: 73 65  Resp: 14 15  Temp:      Last Pain:  Vitals:   08/22/15 1500  TempSrc:   PainSc: 0-No pain                 Shauntae Reitman COKER

## 2015-08-22 NOTE — Transfer of Care (Signed)
Immediate Anesthesia Transfer of Care Note  Patient: Chad Clements  Procedure(s) Performed: Procedure(s): RIGHT PAROTID MASS (Right) EVACUATION HEMATOMA  Patient Location: PACU  Anesthesia Type:General  Level of Consciousness: awake, alert , oriented and patient cooperative  Airway & Oxygen Therapy: Patient Spontanous Breathing and Patient connected to face mask oxygen  Post-op Assessment: Report given to RN and Post -op Vital signs reviewed and stable  Post vital signs: Reviewed and stable  Last Vitals:  Vitals:   08/22/15 0918  BP: (!) 163/83  Pulse: (!) 58  Resp: 18  Temp: 36.7 C    Last Pain:  Vitals:   08/22/15 0918  TempSrc: Oral         Complications: No apparent anesthesia complications

## 2015-08-22 NOTE — Op Note (Signed)
NAMEAXAVIER, Chad Clements            ACCOUNT NO.:  [id number]  MEDICAL RECORD NO.:  [id number]  LOCATION:  [facility]                         FACILITY:  [facility]  PHYSICIAN:  Early Chars. Wilburn Cornelia, M.D.DATE OF BIRTH:  [birth year]  DATE OF PROCEDURE:  08/22/2015 DATE OF DISCHARGE:                              OPERATIVE REPORT   ADDENDUM:  PREOPERATIVE DIAGNOSES: 1. Right parotid mass. 2. Acute postoperative hemorrhage.  POSTOPERATIVE DIAGNOSES: 1. Right parotid mass. 2. Acute postoperative hemorrhage.  SURGICAL PROCEDURE: 1. Right superficial parotidectomy with facial nerve monitoring. 2. Exploration and control of postoperative hemorrhage.  ANESTHESIA:  General LMA.  COMPLICATIONS:  No complications.  BLOOD LOSS:  Less than 100 mL.  The patient transferred from the operating room to the recovery room in stable condition.  BRIEF HISTORY:  The patient is a 75 year old, black male, who underwent right superficial parotidectomy under general anesthesia which is dictated as a separate surgical operative report.  At the time of extubation, the patient was in transit from the operating room and developed acute bleeding from his surgical drain site as well as acute swelling in the right cheek in the parotid dissection region.  Given these findings, we returned directly to the operating room for exploration and control of hemorrhage.  DESCRIPTION OF PROCEDURE:  The patient was brought to the operating room on an emergent basis on August 22, 2015, with acute postoperative hemorrhage involving his right parotidectomy site.  A surgical time-out was performed and correct identification of the patient and the surgical procedure.  The patient was prepped with Betadine solution and draped in a sterile fashion.  He was positioned on the operating table and the procedure was begun.  With the patient prepared for surgery, the recently closed incision was opened by removing the superficial surgical  glue and dividing the deep subcutaneous sutures.  The parotid flap was then elevated anteriorly and the wound bed was explored.  There was moderate amount of clotted material within the site of the surgical procedure and this was carefully cleared with particular attention to avoid any extra irritation or stimulation to the facial nerve.  The wound bed was then thoroughly irrigated with warm saline solution.  There were several small areas of point hemorrhage and no apparent active significant bleeding.  Bipolar cautery was then used at each of the individual sites.  A Valsalva maneuver was performed in order to increase intrathoracic pressure, and the wound was observed carefully.  Again, there were several small areas of oozing which were cauterized, no other further active bleeding.  With the patient stabilized, closure of the incision was re-initiated.  A new 7-French round drain was placed at the depth of the incision and carried out through a previous stab site and sutured in position with a 3-0 Ethilon suture.  The incision was closed then with interrupted 4-0 and 5-0 Vicryl in the deep and superficial subcutaneous fashion and the final skin edge was closed with Dermabond surgical glue.  The patient was awakened from his anesthetic.  He was observed in the operating room.  There was no further bleeding, swelling or significant drainage.  He was then transferred from the operating room to the recovery room in stable  condition.  No complications.  No further bleeding.          ______________________________ Early Chars. Wilburn Cornelia, M.D.     DLS/MEDQ  D:  46/95/0722  T:  08/22/2015  Job:  [id number]

## 2015-08-22 NOTE — Brief Op Note (Signed)
08/22/2015  2:39 PM  PATIENT:  Chad Clements  75 y.o. male  PRE-OPERATIVE DIAGNOSIS:  RIGHT PAROTID MASS Post op Hemorrhage  POST-OPERATIVE DIAGNOSIS:  RIGHT PAROTID MASS Post op Hemorrhage  PROCEDURE:  Procedure(s): RIGHT PAROTID MASS (Right) EVACUATION HEMATOMA  SURGEON:  Surgeon(s) and Role: Panel 1:    * Jerrell Belfast, MD - Primary    * Jodi Marble, MD - Assisting  Panel 2:    * Jerrell Belfast, MD - Primary  PHYSICIAN ASSISTANT:   ASSISTANTS: none   ANESTHESIA:   general  EBL:  Total I/O In: 1500 [I.V.:1500] Out: - 100cc  BLOOD ADMINISTERED:none  DRAINS: (7Fr) Jackson-Pratt drain(s) with closed bulb suction in the Right parotid incision   LOCAL MEDICATIONS USED:  NONE  SPECIMEN:  No Specimen  DISPOSITION OF SPECIMEN:  N/A  COUNTS:  YES  TOURNIQUET:  * No tourniquets in log *  DICTATION: .Other Dictation: Dictation Number [id number]  PLAN OF CARE: Admit for overnight observation  PATIENT DISPOSITION:  PACU - hemodynamically stable.   Delay start of Pharmacological VTE agent (>24hrs) due to surgical blood loss or risk of bleeding: yes

## 2015-08-23 ENCOUNTER — Encounter (HOSPITAL_COMMUNITY): Payer: Self-pay | Admitting: Otolaryngology

## 2015-08-23 DIAGNOSIS — D110 Benign neoplasm of parotid gland: Secondary | ICD-10-CM | POA: Diagnosis not present

## 2015-08-23 NOTE — Progress Notes (Signed)
   ENT Progress Note: POD #1 s/p Procedure(s): RIGHT PAROTID MASS EVACUATION HEMATOMA   Subjective: Stable, no pain  Objective: Vital signs in last 24 hours: Temp:  [98 F (36.7 C)-98.6 F (37 C)] 98.3 F (36.8 C) (08/17 0516) Pulse Rate:  [65-76] 76 (08/17 0516) Resp:  [11-19] 19 (08/17 0516) BP: (126-167)/(66-131) 126/66 (08/17 0516) SpO2:  [98 %-100 %] 100 % (08/17 0516) Weight:  [70.1 kg (154 lb 8.7 oz)] 70.1 kg (154 lb 8.7 oz) (08/16 1710) Weight change:  Last BM Date: 08/21/15  Intake/Output from previous day: 08/16 0701 - 08/17 0700 In: 2332.5 [P.O.:120; I.V.:2212.5] Out: 1475 [Urine:1350; Drains:25; Blood:100] Intake/Output this shift: Total I/O In: 776.3 [P.O.:360; I.V.:416.3] Out: -   Labs: No results for input(s): WBC, HGB, HCT, PLT in the last 72 hours. No results for input(s): NA, K, CL, CO2, GLUCOSE, BUN, CALCIUM in the last 72 hours.  Invalid input(s): CREATININR  Studies/Results: No results found.   PHYSICAL EXAM: Inc intact, no swelling JP removed   Assessment/Plan: Pt doing well  No bleeding or swelling D/C to home    Chad Clements 08/23/2015, 9:18 AM

## 2015-08-23 NOTE — Progress Notes (Signed)
Trude Mcburney to be D/C'd Home per MD order.  Discussed with the patient and all questions fully answered.  VS. JP removal site clean dry and intact dressing. IV catheter discontinued intact. Site without signs and symptoms of complications. Dressing and pressure applied.  An After Visit Summary was printed and given to the patient. Patient received prescription.  D/c education completed with patient/family including follow up instructions, medication list, d/c activities limitations if indicated, with other d/c instructions as indicated by MD - patient able to verbalize understanding, all questions fully answered.   Patient instructed to return to ED, call 911, or call MD for any changes in condition.   Patient to be scorted via WC, and D/C home via private auto.  L'ESPERANCE, Lennis Rader C 08/23/2015 10:40 AM

## 2015-08-23 NOTE — Discharge Summary (Signed)
Physician Discharge Summary  Patient ID: Chad Clements MRN: [id number] DOB/AGE: [birth year] 75 y.o.  Admit date: 08/22/2015 Discharge date: 08/23/2015  Admission Diagnoses:  Principal Problem:   Parotid mass   Discharge Diagnoses:  Same  Surgeries: Procedure(s): RIGHT PAROTID MASS EVACUATION HEMATOMA on 08/22/2015   Consultants: None  Discharged Condition: Improved  Hospital Course: Chad Clements is an 75 y.o. male who was admitted 08/22/2015 with a diagnosis of Parotid mass and went to the operating room on 08/22/2015 and underwent the above named procedures.   Pt stable and d/c'ed on POD#1  Recent vital signs:  Vitals:   08/22/15 2106 08/23/15 0516  BP: 139/79 126/66  Pulse: 76 76  Resp: 19 19  Temp: 98.3 F (36.8 C) 98.3 F (36.8 C)    Recent laboratory studies:  Results for orders placed or performed during the hospital encounter of 55/73/22  Basic metabolic panel  Result Value Ref Range   Sodium 139 135 - 145 mmol/L   Potassium 3.8 3.5 - 5.1 mmol/L   Chloride 106 101 - 111 mmol/L   CO2 26 22 - 32 mmol/L   Glucose, Bld 108 (H) 65 - 99 mg/dL   BUN 13 6 - 20 mg/dL   Creatinine, Ser 1.13 0.61 - 1.24 mg/dL   Calcium 9.1 8.9 - 10.3 mg/dL   GFR calc non Af Amer >60 >60 mL/min   GFR calc Af Amer >60 >60 mL/min   Anion gap 7 5 - 15  CBC  Result Value Ref Range   WBC 4.8 4.0 - 10.5 K/uL   RBC 4.23 4.22 - 5.81 MIL/uL   Hemoglobin 12.0 (L) 13.0 - 17.0 g/dL   HCT 37.4 (L) 39.0 - 52.0 %   MCV 88.4 78.0 - 100.0 fL   MCH 28.4 26.0 - 34.0 pg   MCHC 32.1 30.0 - 36.0 g/dL   RDW 14.6 11.5 - 15.5 %   Platelets 185 150 - 400 K/uL    Discharge Medications:     Medication List    STOP taking these medications   clopidogrel 75 MG tablet Commonly known as:  PLAVIX     TAKE these medications   carvedilol 6.25 MG tablet Commonly known as:  COREG Take 6.25 mg by mouth 2 (two) times daily with a meal.   HYDROcodone-acetaminophen 5-325 MG tablet Commonly  known as:  NORCO Take 1-2 tablets by mouth every 6 (six) hours as needed.   lisinopril 10 MG tablet Commonly known as:  PRINIVIL,ZESTRIL Take 10 mg by mouth daily.   simvastatin 40 MG tablet Commonly known as:  ZOCOR Take 40 mg by mouth every evening.       Diagnostic Studies: No results found.  Disposition: Final discharge disposition not confirmed  Discharge Instructions    Diet - low sodium heart healthy    Complete by:  As directed   Diet - low sodium heart healthy    Complete by:  As directed   Discharge instructions    Complete by:  As directed   1. Limited activity 2. Liquid and soft diet, advance as tolerated 3. May bathe and shower day after surgery 4. Wound care - Gentle cleaning with soap and water 5. DO NOT APPLY ANY OINTMENT 6. Elevate Head of Bed   Increase activity slowly    Complete by:  As directed   Increase activity slowly    Complete by:  As directed      Follow-up Information    Taleen Prosser, MD  In 10 days.   Specialty:  Otolaryngology Contact information: [address] [place] [address] [phone]            Signed: Jerrell Belfast 08/23/2015, 9:24 AM

## 2015-08-23 NOTE — Care Management Note (Signed)
Case Management Note  Patient Details  Name: Chad Clements MRN: [id number] Date of Birth: Oct 11, 1940  Subjective/Objective:                    Action/Plan: Anticipate discharge home today. No further CM needs but will be available should additional discharge needs arise.   Expected Discharge Date:                  Expected Discharge Plan:  Home/Self Care  In-House Referral:  NA  Discharge planning Services  NA  Post Acute Care Choice:  NA Choice offered to:  NA  DME Arranged:    DME Agency:  NA  HH Arranged:  NA HH Agency:  NA  Status of Service:  Completed, signed off  If discussed at [place] of Stay Meetings, dates discussed:    Additional Comments:  Delrae Sawyers, RN 08/23/2015, 10:23 AM

## 2015-10-02 ENCOUNTER — Encounter: Payer: Self-pay | Admitting: Family Medicine

## 2016-10-28 ENCOUNTER — Encounter: Payer: Self-pay | Admitting: Family Medicine

## 2016-10-28 ENCOUNTER — Ambulatory Visit (INDEPENDENT_AMBULATORY_CARE_PROVIDER_SITE_OTHER): Payer: Medicare Other | Admitting: Family Medicine

## 2016-10-28 VITALS — BP 174/90 | HR 72 | Temp 97.9°F | Resp 16 | Ht 71.0 in | Wt 153.0 lb

## 2016-10-28 DIAGNOSIS — I10 Essential (primary) hypertension: Secondary | ICD-10-CM

## 2016-10-28 DIAGNOSIS — Z125 Encounter for screening for malignant neoplasm of prostate: Secondary | ICD-10-CM | POA: Diagnosis not present

## 2016-10-28 DIAGNOSIS — F17200 Nicotine dependence, unspecified, uncomplicated: Secondary | ICD-10-CM | POA: Diagnosis not present

## 2016-10-28 DIAGNOSIS — Z23 Encounter for immunization: Secondary | ICD-10-CM | POA: Diagnosis not present

## 2016-10-28 DIAGNOSIS — I252 Old myocardial infarction: Secondary | ICD-10-CM

## 2016-10-28 DIAGNOSIS — E7800 Pure hypercholesterolemia, unspecified: Secondary | ICD-10-CM

## 2016-10-28 DIAGNOSIS — Z0000 Encounter for general adult medical examination without abnormal findings: Secondary | ICD-10-CM | POA: Diagnosis not present

## 2016-10-28 LAB — LIPID PANEL
CHOL/HDL RATIO: 2.5 (calc) (ref <5.0)
Cholesterol: 137 mg/dL (ref <200)
HDL: 55 mg/dL (ref >40)
LDL CHOLESTEROL (CALC): 68 mg/dL
NON-HDL CHOLESTEROL (CALC): 82 mg/dL (ref <130)
TRIGLYCERIDES: 56 mg/dL (ref <150)

## 2016-10-28 LAB — CBC WITH DIFFERENTIAL/PLATELET
BASOS PCT: 0.7 %
Basophils Absolute: 31 cells/uL (ref 0–200)
EOS PCT: 6 %
Eosinophils Absolute: 264 cells/uL (ref 15–500)
HEMATOCRIT: 35 % — AB (ref 38.5–50.0)
Hemoglobin: 11.7 g/dL — ABNORMAL LOW (ref 13.2–17.1)
LYMPHS ABS: 1421 {cells}/uL (ref 850–3900)
MCH: 29.2 pg (ref 27.0–33.0)
MCHC: 33.4 g/dL (ref 32.0–36.0)
MCV: 87.3 fL (ref 80.0–100.0)
MPV: 8.9 fL (ref 7.5–12.5)
Monocytes Relative: 7.6 %
NEUTROS PCT: 53.4 %
Neutro Abs: 2350 cells/uL (ref 1500–7800)
PLATELETS: 241 10*3/uL (ref 140–400)
RBC: 4.01 10*6/uL — AB (ref 4.20–5.80)
RDW: 13.8 % (ref 11.0–15.0)
TOTAL LYMPHOCYTE: 32.3 %
WBC mixed population: 334 cells/uL (ref 200–950)
WBC: 4.4 10*3/uL (ref 3.8–10.8)

## 2016-10-28 LAB — COMPLETE METABOLIC PANEL WITH GFR
AG Ratio: 1.6 (calc) (ref 1.0–2.5)
ALT: 10 U/L (ref 9–46)
AST: 16 U/L (ref 10–35)
Albumin: 3.9 g/dL (ref 3.6–5.1)
Alkaline phosphatase (APISO): 57 U/L (ref 40–115)
BUN: 15 mg/dL (ref 7–25)
CALCIUM: 8.8 mg/dL (ref 8.6–10.3)
CO2: 29 mmol/L (ref 20–32)
CREATININE: 1.09 mg/dL (ref 0.70–1.18)
Chloride: 103 mmol/L (ref 98–110)
GFR, EST NON AFRICAN AMERICAN: 66 mL/min/{1.73_m2} (ref >=60)
GFR, Est African American: 77 mL/min/{1.73_m2} (ref >=60)
Globulin: 2.5 g/dL (calc) (ref 1.9–3.7)
Glucose, Bld: 103 mg/dL — ABNORMAL HIGH (ref 65–99)
Potassium: 4 mmol/L (ref 3.5–5.3)
Sodium: 140 mmol/L (ref 135–146)
Total Bilirubin: 0.4 mg/dL (ref 0.2–1.2)
Total Protein: 6.4 g/dL (ref 6.1–8.1)

## 2016-10-28 LAB — PSA: PSA: 2.1 ng/mL (ref <=4.0)

## 2016-10-28 MED ORDER — LISINOPRIL 40 MG PO TABS: 40.0000 mg | ORAL_TABLET | Freq: Every day | ORAL | 3 refills | Status: DC

## 2016-10-28 NOTE — Progress Notes (Signed)
Subjective:    Patient ID: Chad Clements, male    DOB: 01-30-1940, 76 y.o.   MRN: [id number]  HPI 01/2014 Patient is here for CPE.  PMH is significant for CAD s/p MI.  Unfortunately the patient continues to smoke. There is also a mass located at the angle of his right mandible that has been there for several years of him. Inconsistency it feels like a sebaceous cyst however I cannot determine if it is attached to the parotid gland. It may also be a lymph node. It is firm in texture. Patient states it is growing. He also reports occasional bright red blood per rectum. On rectal exam he has an external hemorrhoid and also a palpable internal hemorrhoid. However ever, there seems to be a nodule on the right side of his prostate gland.  At that time, my plan was: Patient's physical exam is significant for a mass in the right side of his neck. I believe this is likely a sebaceous cyst however I will obtain a CT scan of the neck to confirm that this is in fact a cyst given its location. Given his smoking history is also possible this could be a parotid gland cancer or possibly an enlarged lymph node like the patient to return as soon as possible fasting so I can check a CBC, CMP, fasting lipid panel, and PSA. Given his slightly abnormal prostate exam if his PSA is elevated greater than 4, I would recommend referral to urology. Patient received his Pneumovax 23 and his flu shot today. His colonoscopy was performed 4 years ago.  10/28/16 Patient has not been seen since 2016.  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   Here for CPE.  No cerns.  Still smoking but no desire to quit at this time.  BP is elevated.  I rechecked it personally and found it to be 170/96.  He denies any chest pain, sob, doe.  He believe his colonoscopy was 8-10 years ago in [place]. Not sure.  Due for prevnar 13 and flu shot.   Past Medical History:  Diagnosis Date  . 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 Coronary artery disease   . Hyperlipidemia   . Hypertension   . MI (myocardial infarction) ([place]) 1992, [place]   has 2 stents (1992, 1998) Reooclusion 2010. Dr. Orpah Greek  . Shortness of breath dyspnea   . Smoker    Past Surgical History:  Procedure Laterality Date  . CARDIAC CATHETERIZATION    . HEMATOMA EVACUATION  08/22/2015   Procedure: EVACUATION HEMATOMA;  Surgeon: Jerrell Belfast, MD;  Location: [place];  Service: ENT;;  . PAROTIDECTOMY Right 08/22/2015   Procedure: RIGHT PAROTID MASS;  Surgeon: Jerrell Belfast, MD;  Location: [place];  Service: ENT;  Laterality: Right;  . TONSILLECTOMY     Current Outpatient Prescriptions on File Prior to Visit  Medication Sig Dispense Refill  . carvedilol (COREG) 6.25 MG tablet Take 6.25 mg by mouth 2 (two) times daily with a meal.    . lisinopril (PRINIVIL,ZESTRIL) 10 MG tablet Take 10 mg by mouth daily.     . simvastatin (ZOCOR) 40 MG tablet Take 40 mg by mouth every evening.     No current facility-administered medications on file prior to visit.    Allergies  Allergen Reactions  . No Known Allergies    Social History   Social History  .  Marital status: Legally Separated    Spouse name: N/A  . Number of children: N/A  . Years of education: N/A   Occupational History  . Not on file.   Social History Main Topics  . Smoking status: Current Every Day Smoker    Packs/day: 0.50  . Smokeless tobacco: Never Used  . Alcohol use No  . Drug use: No  . Sexual activity: Not on file   Other Topics Concern  . Not on file   Social History Narrative  . No narrative on file   Family History  Problem Relation Age of Onset  . Mental illness Brother   . Heart disease Mother   . COPD Father   . Heart disease Sister   . Heart disease Sister      Review of Systems  All other systems reviewed and are negative.      Objective:   Physical Exam  Constitutional: He is  oriented to person, place, and time. He appears well-developed and well-nourished. No distress.  HENT:  Head: Normocephalic and atraumatic.  Right Ear: External ear normal.  Left Ear: External ear normal.  Nose: Nose normal.  Mouth/Throat: Oropharynx is clear and moist. No oropharyngeal exudate.  Eyes: Pupils are equal, round, and reactive to light. Conjunctivae and EOM are normal. Right eye exhibits no discharge. Left eye exhibits no discharge. No scleral icterus.  Neck: Normal range of motion. Neck supple. No JVD present. No tracheal deviation present. No thyromegaly present.  Cardiovascular: Normal rate, regular rhythm and intact distal pulses.  Exam reveals no gallop and no friction rub.   No murmur heard. Pulmonary/Chest: Effort normal and breath sounds normal. No stridor. No respiratory distress. He has no wheezes. He has no rales. He exhibits no tenderness.  Abdominal: Soft. Bowel sounds are normal. He exhibits no distension and no mass. There is no tenderness. There is no rebound and no guarding.  Genitourinary: Rectal exam shows external hemorrhoid. Prostate is enlarged.  Musculoskeletal: Normal range of motion. He exhibits no edema or tenderness.  Lymphadenopathy:    He has no cervical adenopathy.  Neurological: He is alert and oriented to person, place, and time. He has normal reflexes. No cranial nerve deficit. He exhibits normal muscle tone. Coordination normal.  Skin: Skin is warm. No rash noted. He is not diaphoretic. No erythema. No pallor.  Psychiatric: He has a normal mood and affect. His behavior is normal. Judgment and thought content normal.  Vitals reviewed.         Assessment & Plan:  Essential hypertension - Plan: CBC with Differential/Platelet, COMPLETE METABOLIC PANEL WITH GFR, Lipid panel  Pure hypercholesterolemia - Plan: CBC with Differential/Platelet, COMPLETE METABOLIC PANEL WITH GFR, Lipid panel  Smoker  History of MI (myocardial infarction) - Plan:  CBC with Differential/Platelet, COMPLETE METABOLIC PANEL WITH GFR, Lipid panel  General medical exam  Prostate cancer screening - Plan: PSA  Received flu shot and prevnar 13.  Increase lisinopril to 40 mg a day and recheck bp in 3 weeks.  Have family call us back and tell us when and who performed colonoscopy so we can get records.  Check psa.  Also check cbc, cmp, flp.  Goal ldl is less than 70.  Encouraged smoking cessation.

## 2016-10-28 NOTE — Addendum Note (Signed)
Addended by: Shary Decamp B on: 10/28/2016 10:36 AM   Modules accepted: Orders

## 2016-11-18 ENCOUNTER — Encounter: Payer: Self-pay | Admitting: Family Medicine

## 2016-11-18 ENCOUNTER — Ambulatory Visit: Payer: Medicare Other | Admitting: Family Medicine

## 2016-11-18 VITALS — BP 140/80 | HR 64 | Temp 98.6°F | Resp 14 | Ht 71.0 in | Wt 152.0 lb

## 2016-11-18 DIAGNOSIS — I10 Essential (primary) hypertension: Secondary | ICD-10-CM

## 2016-11-18 NOTE — Progress Notes (Signed)
Subjective:    Patient ID: Chad Clements, male    DOB: May 12, 1940, 76 y.o.   MRN: [id number]  HPI 01/2014 Patient is here for CPE.  PMH is significant for CAD s/p MI.  Unfortunately the patient continues to smoke. There is also a mass located at the angle of his right mandible that has been there for several years of him. Inconsistency it feels like a sebaceous cyst however I cannot determine if it is attached to the parotid gland. It may also be a lymph node. It is firm in texture. Patient states it is growing. He also reports occasional bright red blood per rectum. On rectal exam he has an external hemorrhoid and also a palpable internal hemorrhoid. However ever, there seems to be a nodule on the right side of his prostate gland.  At that time, my plan was: Patient's physical exam is significant for a mass in the right side of his neck. I believe this is likely a sebaceous cyst however I will obtain a CT scan of the neck to confirm that this is in fact a cyst given its location. Given his smoking history is also possible this could be a parotid gland cancer or possibly an enlarged lymph node like the patient to return as soon as possible fasting so I can check a CBC, CMP, fasting lipid panel, and PSA. Given his slightly abnormal prostate exam if his PSA is elevated greater than 4, I would recommend referral to urology. Patient received his Pneumovax 23 and his flu shot today. His colonoscopy was performed 4 years ago.  10/28/16 Patient has not been seen since 2016.  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   Here for CPE.  No cerns.  Still smoking but no desire to quit at this time.  BP is elevated.  I rechecked it personally and found it to be 170/96.  He denies any chest pain, sob, doe.  He believe his colonoscopy was 8-10 years ago in [employer]. Not sure.  Due for prevnar 13 and flu shot.  At that time, my plan was: Received flu shot and  prevnar 13.  Increase lisinopril to 40 mg a day and recheck bp in 3 weeks.  Have family call us back and tell us when and who performed colonoscopy so we can get records.  Check psa.  Also check cbc, cmp, flp.  Goal ldl is less than 70.  Encouraged smoking cessation.    11/18/16 Patient's blood pressure today is much better.  It is 140/80.  I confirmed this myself.  He denies any side effects on lisinopril.  He denies any cough or angioedema.  He denies any dizziness or fatigue.  He denies any shortness of breath or chest pain.  He is yet to call back with the name of the gastroenterologist. Past Medical History:  Diagnosis Date  . 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 Coronary artery disease   . Hyperlipidemia   . Hypertension   . MI (myocardial infarction) ([place]) 1992, [place]   has 2 stents (1992, 1998) Reooclusion 2010. Dr. Orpah Greek  . Shortness of breath dyspnea   . Smoker    Past Surgical History:  Procedure Laterality Date  . CARDIAC CATHETERIZATION    . TONSILLECTOMY     Current Outpatient Medications on File Prior to Visit  Medication Sig Dispense Refill  . carvedilol (COREG) 6.25 MG  tablet Take 6.25 mg by mouth 2 (two) times daily with a meal.    . clopidogrel (PLAVIX) 75 MG tablet Take 75 mg by mouth daily.    Marland Kitchen lisinopril (PRINIVIL,ZESTRIL) 10 MG tablet Take 10 mg by mouth daily.     Marland Kitchen lisinopril (PRINIVIL,ZESTRIL) 40 MG tablet Take 1 tablet (40 mg total) by mouth daily. 90 tablet 3  . simvastatin (ZOCOR) 40 MG tablet Take 40 mg by mouth every evening.     No current facility-administered medications on file prior to visit.    Allergies  Allergen Reactions  . No Known Allergies    Social History   Socioeconomic History  . Marital status: Legally Separated    Spouse name: Not on file  . Number of children: Not on file  . Years of education: Not on file  . Highest education level: Not on file  Social Needs  . Financial resource  strain: Not on file  . Food insecurity - worry: Not on file  . Food insecurity - inability: Not on file  . Transportation needs - medical: Not on file  . Transportation needs - non-medical: Not on file  Occupational History  . Not on file  Tobacco Use  . Smoking status: Current Every Day Smoker    Packs/day: 0.50  . Smokeless tobacco: Never Used  Substance and Sexual Activity  . Alcohol use: No  . Drug use: No  . Sexual activity: Not on file  Other Topics Concern  . Not on file  Social History Narrative  . Not on file   Family History  Problem Relation Age of Onset  . Mental illness Brother   . Heart disease Mother   . COPD Father   . Heart disease Sister   . Heart disease Sister      Review of Systems  All other systems reviewed and are negative.      Objective:   Physical Exam  Constitutional: He appears well-developed and well-nourished. No distress.  HENT:  Head: Normocephalic and atraumatic.  Cardiovascular: Normal rate, regular rhythm and intact distal pulses. Exam reveals no gallop and no friction rub.  No murmur heard. Pulmonary/Chest: Effort normal and breath sounds normal. No respiratory distress. He has no wheezes. He has no rales. He exhibits no tenderness.  Abdominal: Soft. Bowel sounds are normal. He exhibits no distension and no mass. There is no tenderness. There is no rebound and no guarding.  Musculoskeletal: He exhibits no edema.  Skin: He is not diaphoretic.  Vitals reviewed.         Assessment & Plan:   Essential hypertension  I am very happy with his blood pressure today.  I have asked him to start checking it at home and call me in a month and let me know what his blood pressure is running.  My goal is to keep his blood pressure under 140 over.  When he calls me back, I have also asked him to provide me the name of the gastroenterologist so that we can update his records regarding his colonoscopy

## 2017-09-17 ENCOUNTER — Other Ambulatory Visit: Payer: Self-pay | Admitting: Family Medicine

## 2018-03-30 ENCOUNTER — Other Ambulatory Visit: Payer: Self-pay | Admitting: Family Medicine

## 2018-06-04 ENCOUNTER — Encounter: Payer: Self-pay | Admitting: Family Medicine

## 2018-08-27 ENCOUNTER — Other Ambulatory Visit: Payer: Self-pay

## 2018-08-27 ENCOUNTER — Encounter: Payer: Self-pay | Admitting: Family Medicine

## 2018-08-27 ENCOUNTER — Ambulatory Visit (HOSPITAL_COMMUNITY)
Admission: RE | Admit: 2018-08-27 | Discharge: 2018-08-27 | Disposition: A | Discharge status: Home / Self Care | Payer: Medicare Other | Source: Ambulatory Visit | Attending: Family Medicine | Admitting: Family Medicine

## 2018-08-27 ENCOUNTER — Ambulatory Visit (INDEPENDENT_AMBULATORY_CARE_PROVIDER_SITE_OTHER): Payer: Medicare Other | Admitting: Family Medicine

## 2018-08-27 VITALS — BP 142/78 | HR 58 | Temp 97.5°F | Resp 16 | Ht 71.0 in | Wt 138.0 lb

## 2018-08-27 DIAGNOSIS — F17200 Nicotine dependence, unspecified, uncomplicated: Secondary | ICD-10-CM | POA: Insufficient documentation

## 2018-08-27 DIAGNOSIS — I252 Old myocardial infarction: Secondary | ICD-10-CM | POA: Diagnosis not present

## 2018-08-27 DIAGNOSIS — R634 Abnormal weight loss: Secondary | ICD-10-CM | POA: Diagnosis present

## 2018-08-27 DIAGNOSIS — Z125 Encounter for screening for malignant neoplasm of prostate: Secondary | ICD-10-CM

## 2018-08-27 DIAGNOSIS — Z0001 Encounter for general adult medical examination with abnormal findings: Secondary | ICD-10-CM | POA: Diagnosis not present

## 2018-08-27 DIAGNOSIS — E7800 Pure hypercholesterolemia, unspecified: Secondary | ICD-10-CM

## 2018-08-27 DIAGNOSIS — I10 Essential (primary) hypertension: Secondary | ICD-10-CM | POA: Diagnosis not present

## 2018-08-27 DIAGNOSIS — Z0000 Encounter for general adult medical examination without abnormal findings: Secondary | ICD-10-CM

## 2018-08-27 NOTE — Progress Notes (Signed)
Subjective:    Patient ID: Chad Clements, male    DOB: September 08, 1940, 78 y.o.   MRN: [id number]  HPI  Patient is here for CPE.  PMH is significant for CAD s/p MI.  I have not seen the patient in 2 years.  However patient has lost 14 pounds since his last visit.  He states that he is lost more than 20 pounds over the last year unintentionally.  He continues to smoke 1 pack of cigarettes per day.  He denies any hemoptysis.  He does report wheezing.  He does report a daily cough.  He denies any chest pain.  He denies any nausea or vomiting.  He denies any melena or hematochezia.  He denies any hematuria or dysuria urgency or frequency.  He denies any fevers or chills.  He denies any muscle pains.  Patient does appear frail and cachectic on his exam today.  Last colonoscopy is uncertain.  He does not know when the last time anyone checked his prostate. Wt Readings from Last 3 Encounters:  08/27/18 138 lb (62.6 kg)  11/18/16 152 lb (68.9 kg)  10/28/16 153 lb (69.4 kg)    Past Medical History:  Diagnosis Date  . 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 Coronary artery disease   . Hyperlipidemia   . Hypertension   . MI (myocardial infarction) ([place]) 1992, [place]   has 2 stents (1992, 1999) instent restenosis of 3rd OM 2010. Dr. Orpah Greek  . Shortness of breath dyspnea   . Smoker    Past Surgical History:  Procedure Laterality Date  . CARDIAC CATHETERIZATION    . HEMATOMA EVACUATION  08/22/2015   Procedure: EVACUATION HEMATOMA;  Surgeon: Jerrell Belfast, MD;  Location: [place];  Service: ENT;;  . PAROTIDECTOMY Right 08/22/2015   Procedure: RIGHT PAROTID MASS;  Surgeon: Jerrell Belfast, MD;  Location: [place];  Service: ENT;  Laterality: Right;  . TONSILLECTOMY     Current Outpatient Medications on File Prior to Visit  Medication Sig Dispense Refill  . carvedilol (COREG) 6.25 MG tablet Take 6.25 mg by mouth 2 (two) times daily with a meal.    . clopidogrel (PLAVIX)  75 MG tablet Take 75 mg by mouth daily.    Marland Kitchen lisinopril (PRINIVIL,ZESTRIL) 40 MG tablet TAKE 1 TABLET BY MOUTH ONCE DAILY. 90 tablet 0  . simvastatin (ZOCOR) 40 MG tablet Take 40 mg by mouth every evening.     No current facility-administered medications on file prior to visit.    Allergies  Allergen Reactions  . No Known Allergies    Social History   Socioeconomic History  . Marital status: Legally Separated    Spouse name: Not on file  . Number of children: Not on file  . Years of education: Not on file  . Highest education level: Not on file  Occupational History  . Not on file  Social Needs  . Financial resource strain: Not on file  . Food insecurity    Worry: Not on file    Inability: Not on file  . Transportation needs    Medical: Not on file    Non-medical: Not on file  Tobacco Use  . Smoking status: Current Every Day Smoker    Packs/day: 0.50  . Smokeless tobacco: Never Used  Substance and Sexual Activity  . Alcohol use: No  . Drug use: No  . Sexual activity: Not on file  Lifestyle  . Physical activity  Days per week: Not on file    Minutes per session: Not on file  . Stress: Not on file  Relationships  . Social Herbalist on phone: Not on file    Gets together: Not on file    Attends religious service: Not on file    Active member of club or organization: Not on file    Attends meetings of clubs or organizations: Not on file    Relationship status: Not on file  . Intimate partner violence    Fear of current or ex partner: Not on file    Emotionally abused: Not on file    Physically abused: Not on file    Forced sexual activity: Not on file  Other Topics Concern  . Not on file  Social History Narrative  . Not on file   Family History  Problem Relation Age of Onset  . Mental illness Brother   . Heart disease Mother   . COPD Father   . Heart disease Sister   . Heart disease Sister      Review of Systems  All other systems  reviewed and are negative.      Objective:   Physical Exam  Constitutional: He is oriented to person, place, and time. He appears cachectic.  Non-toxic appearance. He does not have a sickly appearance. He does not appear ill. No distress.  HENT:  Head: Normocephalic and atraumatic.  Right Ear: External ear normal.  Left Ear: External ear normal.  Nose: Nose normal.  Mouth/Throat: Oropharynx is clear and moist. No oropharyngeal exudate.  Eyes: Pupils are equal, round, and reactive to light. Conjunctivae and EOM are normal. Right eye exhibits no discharge. Left eye exhibits no discharge. No scleral icterus.  Neck: Normal range of motion. Neck supple. No JVD present. No tracheal deviation present. No thyromegaly present.  Cardiovascular: Normal rate, regular rhythm and intact distal pulses. Exam reveals no gallop and no friction rub.  No murmur heard. Pulmonary/Chest: Effort normal. No stridor. No respiratory distress. He has decreased breath sounds. He has wheezes. He has no rhonchi. He has no rales. He exhibits no tenderness.  Abdominal: Soft. Bowel sounds are normal. He exhibits no distension and no mass. There is no abdominal tenderness. There is no rebound and no guarding.  Musculoskeletal: Normal range of motion.        General: No tenderness or edema.  Lymphadenopathy:    He has no cervical adenopathy.  Neurological: He is alert and oriented to person, place, and time. He has normal reflexes. No cranial nerve deficit. He exhibits normal muscle tone. Coordination normal.  Skin: Skin is warm. No rash noted. He is not diaphoretic. No erythema. No pallor.  Psychiatric: He has a normal mood and affect. His behavior is normal. Judgment and thought content normal.  Vitals reviewed.         Assessment & Plan:  The primary encounter diagnosis was General medical exam. Diagnoses of History of MI (myocardial infarction), Essential hypertension, Pure hypercholesterolemia, Smoker, Prostate  cancer screening, and Weight loss were also pertinent to this visit. I am very concerned by the patient's weight loss.  He refuses a flu shot today.  I will obtain a chest x-ray to screen for lung cancer or any underlying pulmonary abnormalities.  I will check a CBC, CMP, fasting lipid panel.  I will check a PSA to screen for prostate cancer.  I will check a fecal occult blood card to evaluate for any evidence  of GI malignancy.  I will await the results of his x-ray and lab work before determining the next step.  Patient may require more in-depth imaging of the chest abdomen and pelvis given his weight loss if the above work-up does not reveal a specific cause.  Given his age and smoking I am concerned about an underlying malignancy.  I will also check a TSH to rule out hypothyroidism.  Patient refuses a flu shot today.

## 2018-09-03 ENCOUNTER — Encounter: Payer: Self-pay | Admitting: Family Medicine

## 2018-10-18 ENCOUNTER — Other Ambulatory Visit: Payer: Self-pay | Admitting: Family Medicine

## 2019-02-07 ENCOUNTER — Other Ambulatory Visit: Payer: Self-pay | Admitting: Family Medicine

## 2019-06-09 ENCOUNTER — Other Ambulatory Visit: Payer: Self-pay | Admitting: Family Medicine

## 2019-08-26 ENCOUNTER — Other Ambulatory Visit: Payer: Self-pay

## 2019-08-26 ENCOUNTER — Other Ambulatory Visit: Payer: Medicare Other

## 2019-08-26 LAB — LIPID PANEL
Cholesterol: 155 mg/dL (ref <200)
HDL: 53 mg/dL (ref >=40)
LDL Cholesterol (Calc): 87 mg/dL (calc)
Non-HDL Cholesterol (Calc): 102 mg/dL (calc) (ref <130)
Total CHOL/HDL Ratio: 2.9 (calc) (ref <5.0)
Triglycerides: 66 mg/dL (ref <150)

## 2019-08-26 LAB — CBC WITH DIFFERENTIAL/PLATELET
Absolute Monocytes: 441 cells/uL (ref 200–950)
Basophils Absolute: 50 cells/uL (ref 0–200)
Basophils Relative: 1.1 %
Eosinophils Absolute: 261 cells/uL (ref 15–500)
Eosinophils Relative: 5.8 %
HCT: 34.1 % — ABNORMAL LOW (ref 38.5–50.0)
Hemoglobin: 11.1 g/dL — ABNORMAL LOW (ref 13.2–17.1)
Lymphs Abs: 1116 cells/uL (ref 850–3900)
MCH: 29.1 pg (ref 27.0–33.0)
MCHC: 32.6 g/dL (ref 32.0–36.0)
MCV: 89.5 fL (ref 80.0–100.0)
MPV: 9.3 fL (ref 7.5–12.5)
Monocytes Relative: 9.8 %
Neutro Abs: 2633 cells/uL (ref 1500–7800)
Neutrophils Relative %: 58.5 %
Platelets: 212 10*3/uL (ref 140–400)
RBC: 3.81 10*6/uL — ABNORMAL LOW (ref 4.20–5.80)
RDW: 14.1 % (ref 11.0–15.0)
Total Lymphocyte: 24.8 %
WBC: 4.5 10*3/uL (ref 3.8–10.8)

## 2019-08-26 LAB — COMPLETE METABOLIC PANEL WITH GFR
AG Ratio: 1.5 (calc) (ref 1.0–2.5)
ALT: 11 U/L (ref 9–46)
AST: 16 U/L (ref 10–35)
Albumin: 4 g/dL (ref 3.6–5.1)
Alkaline phosphatase (APISO): 60 U/L (ref 35–144)
BUN: 12 mg/dL (ref 7–25)
CO2: 31 mmol/L (ref 20–32)
Calcium: 9 mg/dL (ref 8.6–10.3)
Chloride: 102 mmol/L (ref 98–110)
Creat: 1.05 mg/dL (ref 0.70–1.18)
GFR, Est African American: 78 mL/min/{1.73_m2} (ref >=60)
GFR, Est Non African American: 68 mL/min/{1.73_m2} (ref >=60)
Globulin: 2.6 g/dL (calc) (ref 1.9–3.7)
Glucose, Bld: 104 mg/dL — ABNORMAL HIGH (ref 65–99)
Potassium: 3.9 mmol/L (ref 3.5–5.3)
Sodium: 139 mmol/L (ref 135–146)
Total Bilirubin: 0.5 mg/dL (ref 0.2–1.2)
Total Protein: 6.6 g/dL (ref 6.1–8.1)

## 2019-08-26 LAB — PSA: PSA: 1.8 ng/mL (ref <=4.0)

## 2019-08-26 LAB — TSH: TSH: 1.96 mIU/L (ref 0.40–4.50)

## 2019-08-29 ENCOUNTER — Other Ambulatory Visit: Payer: Self-pay

## 2019-08-29 ENCOUNTER — Ambulatory Visit (INDEPENDENT_AMBULATORY_CARE_PROVIDER_SITE_OTHER): Payer: Medicare Other | Admitting: Family Medicine

## 2019-08-29 VITALS — BP 140/70 | HR 62 | Temp 97.4°F | Ht 70.0 in | Wt 132.0 lb

## 2019-08-29 DIAGNOSIS — I10 Essential (primary) hypertension: Secondary | ICD-10-CM

## 2019-08-29 DIAGNOSIS — E7800 Pure hypercholesterolemia, unspecified: Secondary | ICD-10-CM

## 2019-08-29 DIAGNOSIS — I252 Old myocardial infarction: Secondary | ICD-10-CM

## 2019-08-29 DIAGNOSIS — Z125 Encounter for screening for malignant neoplasm of prostate: Secondary | ICD-10-CM | POA: Diagnosis not present

## 2019-08-29 DIAGNOSIS — F17200 Nicotine dependence, unspecified, uncomplicated: Secondary | ICD-10-CM

## 2019-08-29 DIAGNOSIS — Z0001 Encounter for general adult medical examination with abnormal findings: Secondary | ICD-10-CM | POA: Diagnosis not present

## 2019-08-29 DIAGNOSIS — Z0000 Encounter for general adult medical examination without abnormal findings: Secondary | ICD-10-CM

## 2019-08-29 MED ORDER — ALBUTEROL SULFATE HFA 108 (90 BASE) MCG/ACT IN AERS: 2.0000 | INHALATION_SPRAY | Freq: Four times a day (QID) | RESPIRATORY_TRACT | 3 refills | Status: DC | PRN

## 2019-08-29 NOTE — Progress Notes (Signed)
Subjective:    Patient ID: Chad Clements, male    DOB: 1940/04/15, 79 y.o.   MRN: [id number]  HPI  Patient is here for CPE.  PMH is significant for CAD s/p MI.  Chest x-ray last year showed emphysema but no malignancy.  Patient recently had lab work to check a PSA which was within normal limits and down from 2.1 of last year at 1.8.  He has not had a colonoscopy in quite some time.  Per his daughter's report who is with him today it is probably been more than 15 years.  Therefore he is due for a colonoscopy.  However he is extremely frail.  He also has moderate emphysema based on exam.  Therefore we had a long discussion today about his age, his frailty, and his smoking and emphysema.  I am not sure that a colonoscopy would be in his best interest.  I have asked the patient to think about it and then decide if he wants me to schedule him for a colonoscopy.  Otherwise he is doing well.  He continues to lose weight however this seems to be due to protein calorie malnutrition.  He eats very little.  His daughter states that he only eats 1 meal a day.  Otherwise he tends to just graze.  He denies any melena or hematochezia or hemoptysis or chest pain or angina.  He does have shortness of breath with activity.  He gets easily winded simply walking to his car.  Today on exam he is wheezing.  He denies any depression or memory loss or falls Wt Readings from Last 3 Encounters:  08/29/19 132 lb (59.9 kg)  08/27/18 138 lb (62.6 kg)  11/18/16 152 lb (68.9 kg)    Past Medical History:  Diagnosis Date  . 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 Coronary artery disease   . Hyperlipidemia   . Hypertension   . MI (myocardial infarction) ([place]) 1992, [place]   has 2 stents (1992, 1999) instent restenosis of 3rd OM 2010. Dr. Orpah Greek  . Shortness of breath dyspnea   . Smoker    Past Surgical History:  Procedure Laterality Date  . CARDIAC CATHETERIZATION    . HEMATOMA  EVACUATION  08/22/2015   Procedure: EVACUATION HEMATOMA;  Surgeon: Jerrell Belfast, MD;  Location: [facility];  Service: ENT;;  . PAROTIDECTOMY Right 08/22/2015   Procedure: RIGHT PAROTID MASS;  Surgeon: Jerrell Belfast, MD;  Location: [facility];  Service: ENT;  Laterality: Right;  . TONSILLECTOMY     Current Outpatient Medications on File Prior to Visit  Medication Sig Dispense Refill  . carvedilol (COREG) 6.25 MG tablet Take 6.25 mg by mouth 2 (two) times daily with a meal.    . clopidogrel (PLAVIX) 75 MG tablet Take 75 mg by mouth daily.    . GNP ASPIRIN LOW DOSE 81 MG EC tablet Take 81 mg by mouth every other day.     . lisinopril (ZESTRIL) 40 MG tablet TAKE 1 TABLET BY MOUTH ONCE DAILY. 90 tablet 1  . prasugrel (EFFIENT) 10 MG TABS tablet     . rosuvastatin (CRESTOR) 20 MG tablet Take 20 mg by mouth daily.      No current facility-administered medications on file prior to visit.   Allergies  Allergen Reactions  . No Known Allergies    Social History   Socioeconomic History  . Marital status: Legally Separated    Spouse name: Not  on file  . Number of children: Not on file  . Years of education: Not on file  . Highest education level: Not on file  Occupational History  . Not on file  Tobacco Use  . Smoking status: Current Every Day Smoker    Packs/day: 0.50  . Smokeless tobacco: Never Used  Substance and Sexual Activity  . Alcohol use: No  . Drug use: No  . Sexual activity: Not on file  Other Topics Concern  . Not on file  Social History Narrative  . Not on file   Social Determinants of Health   Financial Resource Strain:   . Difficulty of Paying Living Expenses: Not on file  Food Insecurity:   . Worried About Charity fundraiser in the Last Year: Not on file  . Ran Out of Food in the Last Year: Not on file  Transportation Needs:   . Lack of Transportation (Medical): Not on file  . Lack of Transportation (Non-Medical): Not on file  Physical Activity:   . Days of  Exercise per Week: Not on file  . Minutes of Exercise per Session: Not on file  Stress:   . Feeling of Stress : Not on file  Social Connections:   . Frequency of Communication with Friends and Family: Not on file  . Frequency of Social Gatherings with Friends and Family: Not on file  . Attends Religious Services: Not on file  . Active Member of Clubs or Organizations: Not on file  . Attends Archivist Meetings: Not on file  . Marital Status: Not on file  Intimate Partner Violence:   . Fear of Current or Ex-Partner: Not on file  . Emotionally Abused: Not on file  . Physically Abused: Not on file  . Sexually Abused: Not on file   Family History  Problem Relation Age of Onset  . Mental illness Brother   . Heart disease Mother   . COPD Father   . Heart disease Sister   . Heart disease Sister      Review of Systems  All other systems reviewed and are negative.      Objective:   Physical Exam Vitals reviewed.  Constitutional:      General: He is not in acute distress.    Appearance: He is underweight. He is not ill-appearing, toxic-appearing or diaphoretic.  HENT:     Head: Normocephalic and atraumatic.     Right Ear: External ear normal.     Left Ear: External ear normal.     Nose: Nose normal.     Mouth/Throat:     Pharynx: No oropharyngeal exudate.  Eyes:     General: No scleral icterus.       Right eye: No discharge.        Left eye: No discharge.     Conjunctiva/sclera: Conjunctivae normal.     Pupils: Pupils are equal, round, and reactive to light.  Neck:     Thyroid: No thyromegaly.     Vascular: No JVD.     Trachea: No tracheal deviation.  Cardiovascular:     Rate and Rhythm: Normal rate and regular rhythm.     Heart sounds: No murmur heard.  No friction rub. No gallop.   Pulmonary:     Effort: Pulmonary effort is normal. No respiratory distress.     Breath sounds: No stridor. Decreased breath sounds and wheezing present. No rhonchi or rales.   Chest:     Chest wall: No tenderness.  Abdominal:     General: Bowel sounds are normal. There is no distension.     Palpations: Abdomen is soft. There is no mass.     Tenderness: There is no abdominal tenderness. There is no guarding or rebound.  Musculoskeletal:        General: No tenderness. Normal range of motion.     Cervical back: Normal range of motion and neck supple.  Lymphadenopathy:     Cervical: No cervical adenopathy.  Skin:    General: Skin is warm.     Coloration: Skin is not pale.     Findings: No erythema or rash.  Neurological:     Mental Status: He is alert and oriented to person, place, and time.     Cranial Nerves: No cranial nerve deficit.     Motor: No abnormal muscle tone.     Coordination: Coordination normal.     Deep Tendon Reflexes: Reflexes are normal and symmetric.  Psychiatric:        Behavior: Behavior normal.        Thought Content: Thought content normal.        Judgment: Judgment normal.     Wt Readings from Last 3 Encounters:  08/29/19 132 lb (59.9 kg)  08/27/18 138 lb (62.6 kg)  11/18/16 152 lb (68.9 kg)         Assessment & Plan:  General medical exam - Plan: CBC with Differential/Platelet, COMPLETE METABOLIC PANEL WITH GFR, Lipid panel, PSA  History of MI (myocardial infarction) - Plan: CBC with Differential/Platelet, COMPLETE METABOLIC PANEL WITH GFR, Lipid panel  Essential hypertension - Plan: CBC with Differential/Platelet, COMPLETE METABOLIC PANEL WITH GFR, Lipid panel  Prostate cancer screening - Plan: PSA  Smoker  Pure hypercholesterolemia - Plan: CBC with Differential/Platelet, COMPLETE METABOLIC PANEL WITH GFR, Lipid panel  I suspect that his weight loss is due to protein calorie malnutrition.  I recommended adding 1 can of Ensure twice daily in addition to his regular meals.  I have checked a CBC, CMP and a lipid panel which I reviewed with the patient today.  His LDL cholesterol is slightly elevated at 87.  We  discussed adding Zetia to his Crestor 20 mg a day but he has declined this.  I strongly recommended smoking cessation however the patient has no desire to quit smoking at the present time.  We will use albuterol 2 puffs inhaled every 6 hours as needed for wheezing and shortness of breath.  If beneficial, we can consider putting the patient on Anoro or Trelegy as I believe he would benefit from this.  Blood pressure today is adequately controlled at 140/70.  Patient will consider a colonoscopy and let me know if he wants me to schedule this for him.  His PSA is within normal limits.  He denies any falls, depression, or memory loss

## 2019-11-28 ENCOUNTER — Telehealth: Payer: Self-pay

## 2019-11-28 NOTE — Telephone Encounter (Signed)
Pt daughter [name] verbalized understanding and is going to take Chad Clements to the ER per Provider K. Buelah Manis, MD instructions.

## 2019-11-28 NOTE — Telephone Encounter (Signed)
Pt needs to be evaluated if he is acutely ill and requiring that much albuterol That means he is in respiratory distress Take him to the ER

## 2019-11-28 NOTE — Telephone Encounter (Signed)
Pt dx with emphysema back in August, he's used a  Inhaler up in 3 days, daughter isn't sure if it's pneumonia  said he's been going down hill since then, he took his booster shot 2 weeks ago. Daughter is asking can he get an chest xray for PT?

## 2019-12-04 ENCOUNTER — Encounter (HOSPITAL_COMMUNITY): Payer: Self-pay

## 2019-12-04 ENCOUNTER — Emergency Department (HOSPITAL_COMMUNITY): Payer: Medicare Other

## 2019-12-04 ENCOUNTER — Emergency Department (HOSPITAL_COMMUNITY)
Admission: EM | Admit: 2019-12-04 | Discharge: 2019-12-04 | Disposition: A | Discharge status: Home / Self Care | Payer: Medicare Other | Attending: Emergency Medicine | Admitting: Emergency Medicine

## 2019-12-04 DIAGNOSIS — F17210 Nicotine dependence, cigarettes, uncomplicated: Secondary | ICD-10-CM | POA: Insufficient documentation

## 2019-12-04 DIAGNOSIS — I10 Essential (primary) hypertension: Secondary | ICD-10-CM | POA: Insufficient documentation

## 2019-12-04 DIAGNOSIS — Z79899 Other long term (current) drug therapy: Secondary | ICD-10-CM | POA: Diagnosis not present

## 2019-12-04 DIAGNOSIS — Z85118 Personal history of other malignant neoplasm of bronchus and lung: Secondary | ICD-10-CM | POA: Insufficient documentation

## 2019-12-04 DIAGNOSIS — J441 Chronic obstructive pulmonary disease with (acute) exacerbation: Secondary | ICD-10-CM | POA: Diagnosis not present

## 2019-12-04 DIAGNOSIS — Z20822 Contact with and (suspected) exposure to covid-19: Secondary | ICD-10-CM | POA: Diagnosis not present

## 2019-12-04 DIAGNOSIS — I2510 Atherosclerotic heart disease of native coronary artery without angina pectoris: Secondary | ICD-10-CM | POA: Diagnosis not present

## 2019-12-04 DIAGNOSIS — R0602 Shortness of breath: Secondary | ICD-10-CM | POA: Diagnosis present

## 2019-12-04 HISTORY — DX: Chronic obstructive pulmonary disease, unspecified: J44.9

## 2019-12-04 HISTORY — DX: Malignant (primary) neoplasm, unspecified: C80.1

## 2019-12-04 LAB — CBC WITH DIFFERENTIAL/PLATELET
Abs Immature Granulocytes: 0.01 10*3/uL (ref 0.00–0.07)
Basophils Absolute: 0.1 10*3/uL (ref 0.0–0.1)
Basophils Relative: 1 %
Eosinophils Absolute: 0.7 10*3/uL — ABNORMAL HIGH (ref 0.0–0.5)
Eosinophils Relative: 18 %
HCT: 38.6 % — ABNORMAL LOW (ref 39.0–52.0)
Hemoglobin: 12.3 g/dL — ABNORMAL LOW (ref 13.0–17.0)
Immature Granulocytes: 0 %
Lymphocytes Relative: 29 %
Lymphs Abs: 1.2 10*3/uL (ref 0.7–4.0)
MCH: 29.2 pg (ref 26.0–34.0)
MCHC: 31.9 g/dL (ref 30.0–36.0)
MCV: 91.7 fL (ref 80.0–100.0)
Monocytes Absolute: 0.4 10*3/uL (ref 0.1–1.0)
Monocytes Relative: 10 %
Neutro Abs: 1.7 10*3/uL (ref 1.7–7.7)
Neutrophils Relative %: 42 %
Platelets: 220 10*3/uL (ref 150–400)
RBC: 4.21 MIL/uL — ABNORMAL LOW (ref 4.22–5.81)
RDW: 14.2 % (ref 11.5–15.5)
WBC: 4.1 10*3/uL (ref 4.0–10.5)
nRBC: 0 % (ref 0.0–0.2)

## 2019-12-04 LAB — COMPREHENSIVE METABOLIC PANEL
ALT: 17 U/L (ref 0–44)
AST: 18 U/L (ref 15–41)
Albumin: 3.7 g/dL (ref 3.5–5.0)
Alkaline Phosphatase: 57 U/L (ref 38–126)
Anion gap: 6 (ref 5–15)
BUN: 17 mg/dL (ref 8–23)
CO2: 30 mmol/L (ref 22–32)
Calcium: 9 mg/dL (ref 8.9–10.3)
Chloride: 101 mmol/L (ref 98–111)
Creatinine, Ser: 1.09 mg/dL (ref 0.61–1.24)
GFR, Estimated: >60 mL/min (ref >60)
Glucose, Bld: 95 mg/dL (ref 70–99)
Potassium: 4.2 mmol/L (ref 3.5–5.1)
Sodium: 137 mmol/L (ref 135–145)
Total Bilirubin: 0.3 mg/dL (ref 0.3–1.2)
Total Protein: 7.1 g/dL (ref 6.5–8.1)

## 2019-12-04 LAB — RESP PANEL BY RT-PCR (FLU A&B, COVID) ARPGX2
Influenza A by PCR: NEGATIVE
Influenza B by PCR: NEGATIVE
SARS Coronavirus 2 by RT PCR: NEGATIVE

## 2019-12-04 LAB — TROPONIN I (HIGH SENSITIVITY)
Troponin I (High Sensitivity): 13 ng/L (ref <18)
Troponin I (High Sensitivity): 12 ng/L (ref <18)

## 2019-12-04 LAB — BRAIN NATRIURETIC PEPTIDE: B Natriuretic Peptide: 49 pg/mL (ref 0.0–100.0)

## 2019-12-04 MED ORDER — MAGNESIUM SULFATE 2 GM/50ML IV SOLN
2.0000 g | Freq: Once | INTRAVENOUS | Status: AC
  Administered 2019-12-04: 2 g via INTRAVENOUS
  Filled 2019-12-04: qty 50

## 2019-12-04 MED ORDER — ALBUTEROL SULFATE (2.5 MG/3ML) 0.083% IN NEBU
2.5000 mg | INHALATION_SOLUTION | Freq: Once | RESPIRATORY_TRACT | Status: AC
  Administered 2019-12-04: 2.5 mg via RESPIRATORY_TRACT
  Filled 2019-12-04: qty 3

## 2019-12-04 MED ORDER — DOXYCYCLINE HYCLATE 100 MG PO CAPS: 100.0000 mg | ORAL_CAPSULE | Freq: Two times a day (BID) | ORAL | 0 refills | Status: DC

## 2019-12-04 MED ORDER — PREDNISONE 10 MG PO TABS: 20.0000 mg | ORAL_TABLET | Freq: Every day | ORAL | 0 refills | Status: DC

## 2019-12-04 MED ORDER — IPRATROPIUM-ALBUTEROL 0.5-2.5 (3) MG/3ML IN SOLN
3.0000 mL | Freq: Once | RESPIRATORY_TRACT | Status: AC
  Administered 2019-12-04: 3 mL via RESPIRATORY_TRACT
  Filled 2019-12-04: qty 3

## 2019-12-04 MED ORDER — METHYLPREDNISOLONE SODIUM SUCC 125 MG IJ SOLR
125.0000 mg | Freq: Once | INTRAMUSCULAR | Status: AC
  Administered 2019-12-04: 125 mg via INTRAVENOUS
  Filled 2019-12-04: qty 2

## 2019-12-04 NOTE — ED Triage Notes (Signed)
Pt brought in by EMS due to SOB esp worse with exertion for one week. Occasional cough. Reports thick brown mucous

## 2019-12-04 NOTE — ED Provider Notes (Signed)
[facility] EMERGENCY DEPARTMENT Provider Note   CSN: [id number] Arrival date & time: 12/04/19  0840     History Chief Complaint  Patient presents with   Shortness of Breath    Chad Clements is a 79 y.o. male.  Patient complains of some shortness of breath..  Patient has a history of COPD.  Laying of some wheezing  The history is provided by the patient and medical records.  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 Dr. Orpah Greek   Shortness of breath dyspnea    Smoker     Patient Active Problem List   Diagnosis Date Noted   Parotid mass 08/22/2015   MI (myocardial infarction) ([place])    Hyperlipidemia    Hypertension    Smoker     Past Surgical History:  Procedure Laterality Date   CARDIAC CATHETERIZATION     heart stents     HEMATOMA EVACUATION  08/22/2015   Procedure: EVACUATION HEMATOMA;  Surgeon: Jerrell Belfast, MD;  Location: [facility];  Service: ENT;;   PAROTIDECTOMY Right 08/22/2015   Procedure: RIGHT PAROTID MASS;  Surgeon: Jerrell Belfast, MD;  Location: [facility] OR;  Service: ENT;  Laterality: Right;   TONSILLECTOMY         Family History  Problem Relation Age of Onset   Mental illness Brother    Heart disease Mother    COPD Father    Heart  disease Sister    Heart disease Sister     Social History   Tobacco Use   Smoking status: Current Every Day Smoker    Packs/day: 0.50   Smokeless tobacco: Never Used  Substance Use Topics   Alcohol use: No   Drug use: No    Home Medications Prior to Admission medications   Medication Sig Start Date End Date Taking? Authorizing Provider  albuterol (VENTOLIN HFA) 108 (90 Base) MCG/ACT inhaler Inhale 2 puffs into the lungs every 6 (six) hours as needed for wheezing or shortness of breath. 08/29/19  Yes Susy Frizzle, MD  carvedilol (COREG) 6.25 MG tablet Take 6.25 mg by mouth 2 (two) times daily with a meal.   Yes [provider]  lisinopril (ZESTRIL) 40 MG tablet TAKE 1 TABLET BY MOUTH ONCE DAILY. Patient taking differently: Take 40 mg by mouth daily.  06/09/19  Yes Susy Frizzle, MD  prasugrel (EFFIENT) 10 MG TABS tablet Take 10 mg by mouth daily.  08/16/18  Yes [provider]  rosuvastatin (CRESTOR) 20 MG tablet Take 20 mg by mouth at bedtime.  08/16/18  Yes [provider]  doxycycline (VIBRAMYCIN) 100 MG capsule Take 1 capsule (100 mg total) by mouth 2 (two) times daily. One po bid x 7 days  12/04/19   Milton Ferguson, MD  predniSONE (DELTASONE) 10 MG tablet Take 2 tablets (20 mg total) by mouth daily. 12/04/19   Milton Ferguson, MD    Allergies    No known allergies  Review of Systems   Review of Systems  Constitutional: Negative for appetite change and fatigue.  HENT: Negative for congestion, ear discharge and sinus pressure.   Eyes: Negative for discharge.  Respiratory: Positive for shortness of breath. Negative for cough.   Cardiovascular: Negative for chest pain.  Gastrointestinal: Negative for abdominal pain and diarrhea.  Genitourinary: Negative for frequency and hematuria.  Musculoskeletal: Negative for back pain.  Skin: Negative for rash.  Neurological: Negative for seizures and headaches.  Psychiatric/Behavioral: Negative for  hallucinations.    Physical Exam Updated Vital Signs BP 117/79    Pulse 65    Temp 98 F (36.7 C) (Oral)    Resp 18    Ht 5\' 10"  (1.778 m)    SpO2 95%    BMI 18.94 kg/m   Physical Exam Vitals and nursing note reviewed.  Constitutional:      Appearance: He is well-developed.  HENT:     Head: Normocephalic.     Nose: Nose normal.  Eyes:     General: No scleral icterus.    Conjunctiva/sclera: Conjunctivae normal.  Neck:     Thyroid: No thyromegaly.  Cardiovascular:     Rate and Rhythm: Normal rate and regular rhythm.     Heart sounds: No murmur heard.  No friction rub. No gallop.   Pulmonary:     Breath sounds: No stridor. Wheezing present. No rales.  Chest:     Chest wall: No tenderness.  Abdominal:     General: There is no distension.     Tenderness: There is no abdominal tenderness. There is no rebound.  Musculoskeletal:        General: Normal range of motion.     Cervical back: Neck supple.  Lymphadenopathy:     Cervical: No cervical adenopathy.  Skin:    Findings: No erythema or rash.  Neurological:     Mental Status: He is alert and oriented to person, place, and time.     Motor: No abnormal muscle tone.     Coordination: Coordination normal.  Psychiatric:        Behavior: Behavior normal.     ED Results / Procedures / Treatments   Labs (all labs ordered are listed, but only abnormal results are displayed) Labs Reviewed  CBC WITH DIFFERENTIAL/PLATELET - Abnormal; Notable for the following components:      Result Value   RBC 4.21 (*)    Hemoglobin 12.3 (*)    HCT 38.6 (*)    Eosinophils Absolute 0.7 (*)    All other components within normal limits  RESP PANEL BY RT-PCR (FLU A&B, COVID) ARPGX2  COMPREHENSIVE METABOLIC PANEL  BRAIN NATRIURETIC PEPTIDE  TROPONIN I (HIGH SENSITIVITY)  TROPONIN I (HIGH SENSITIVITY)    EKG EKG Interpretation  Date/Time:  Sunday December 04 2019 09:04:22 EST Ventricular Rate:  59 PR Interval:    QRS Duration: 88 QT  Interval:  371 QTC Calculation: 368 R Axis:   50 Text Interpretation: Unknown rhythm, irregular rate Nonspecific T abnormalities, lateral leads Baseline wander in lead(s) V1 Confirmed by Milton Ferguson [phone]) on 12/04/2019 12:55:57 PM   Radiology DG Chest Port 1 View  Result Date: 12/04/2019 CLINICAL DATA:  Shortness of breath. EXAM: PORTABLE CHEST 1 VIEW COMPARISON:  08/27/2018 FINDINGS: Heart size appears  within normal limits. There is no pleural effusion or edema identified. Lungs are hyperinflated and there are coarsened interstitial markings noted bilaterally. No superimposed airspace consolidation. IMPRESSION: 1. No acute cardiopulmonary abnormalities. 2. COPD/emphysema. Electronically Signed   By: Kerby Moors M.D.   On: 12/04/2019 12:06    Procedures Procedures (including critical care time)  Medications Ordered in ED Medications  methylPREDNISolone sodium succinate (SOLU-MEDROL) 125 mg/2 mL injection 125 mg (125 mg Intravenous Given 12/04/19 1008)  magnesium sulfate IVPB 2 g 50 mL (0 g Intravenous Stopped 12/04/19 1142)  ipratropium-albuterol (DUONEB) 0.5-2.5 (3) MG/3ML nebulizer solution 3 mL (3 mLs Nebulization Given 12/04/19 1130)  albuterol (PROVENTIL) (2.5 MG/3ML) 0.083% nebulizer solution 2.5 mg (2.5 mg Nebulization Given 12/04/19 1130)    ED Course  I have reviewed the triage vital signs and the nursing notes.  Pertinent labs & imaging results that were available during my care of the patient were reviewed by me and considered in my medical decision making (see chart for details).    MDM Rules/Calculators/A&P                         Patient with exacerbation of COPD he will be sent home with prednisone and doxycycline and will use inhaler 4 times a day.  Labs unremarkable including CBC chemistries troponin BNP and a negative Covid test.  Chest x-ray shows COPD Final Clinical Impression(s) / ED Diagnoses Final diagnoses:  COPD exacerbation ([place])    Rx / DC  Orders ED Discharge Orders         Ordered    predniSONE (DELTASONE) 10 MG tablet  Daily        12/04/19 1259    doxycycline (VIBRAMYCIN) 100 MG capsule  2 times daily        12/04/19 1259           Milton Ferguson, MD 12/06/19 [id number]

## 2019-12-04 NOTE — Discharge Instructions (Addendum)
Follow up with your md this week.   Use your inhaler every 4-6 hours for wheezing or shortness of breath

## 2019-12-15 ENCOUNTER — Ambulatory Visit: Payer: Medicare Other | Admitting: Family Medicine

## 2019-12-15 ENCOUNTER — Ambulatory Visit (INDEPENDENT_AMBULATORY_CARE_PROVIDER_SITE_OTHER): Payer: Medicare Other | Admitting: Family Medicine

## 2019-12-15 ENCOUNTER — Other Ambulatory Visit: Payer: Self-pay

## 2019-12-15 VITALS — BP 130/80 | HR 58 | Temp 97.7°F | Ht 70.0 in | Wt 138.0 lb

## 2019-12-15 DIAGNOSIS — J439 Emphysema, unspecified: Secondary | ICD-10-CM

## 2019-12-15 DIAGNOSIS — Z23 Encounter for immunization: Secondary | ICD-10-CM

## 2019-12-15 MED ORDER — ANORO ELLIPTA 62.5-25 MCG/INH IN AEPB: 1.0000 | INHALATION_SPRAY | Freq: Every day | RESPIRATORY_TRACT | 11 refills | Status: DC

## 2019-12-15 MED ORDER — ALBUTEROL SULFATE HFA 108 (90 BASE) MCG/ACT IN AERS: 2.0000 | INHALATION_SPRAY | Freq: Four times a day (QID) | RESPIRATORY_TRACT | 3 refills | Status: DC | PRN

## 2019-12-15 NOTE — Progress Notes (Signed)
Subjective:    Patient ID: Chad Clements, male    DOB: 1940/11/19, 79 y.o.   MRN: [id number]  HPI  Patient had an x-ray that has previously shown emphysema.  He continues to smoke 1/2 pack of cigarettes per day.  Even at his physical he reported dyspnea on exertion and becoming easily winded.  Therefore I believe he is likely had early stages of emphysema for quite some time.  Unfortunately, he recently had to go the emergency room with a COPD exacerbation.  There he received albuterol and it helped substantially.  However he is here today for follow-up.  Since being seen in the emergency room however he has been using albuterol 2 puffs every 6 hours every day.  He denies any chest pain.  He denies any tachycardia or irregular heartbeats.  On examination today he has diminished breath sounds bilaterally but there is no expiratory wheezing.  He continues to smoke.  He denies any hemoptysis or fevers or chills Wt Readings from Last 3 Encounters:  12/15/19 138 lb (62.6 kg)  08/29/19 132 lb (59.9 kg)  08/27/18 138 lb (62.6 kg)    Past Medical History:  Diagnosis Date  . 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 Cancer [facility])    lung cancer dx in august  . COPD (chronic obstructive pulmonary disease) ([place])   . Coronary artery disease   . Hyperlipidemia   . Hyperlipidemia   . Hypertension   . MI (myocardial infarction) ([place]) 1992, [place]   has 2 stents (1992, 1999) instent restenosis of 3rd OM 2010. Dr. Orpah Greek  . Shortness of breath dyspnea   . Smoker    Past Surgical History:  Procedure Laterality Date  . CARDIAC CATHETERIZATION    . heart stents    . HEMATOMA EVACUATION  08/22/2015   Procedure: EVACUATION HEMATOMA;  Surgeon: Jerrell Belfast, MD;  Location: [place];  Service: ENT;;  . PAROTIDECTOMY Right 08/22/2015   Procedure: RIGHT PAROTID MASS;  Surgeon: Jerrell Belfast, MD;  Location: [place] [facility];  Service: ENT;  Laterality: Right;  . TONSILLECTOMY      Current Outpatient Medications on File Prior to Visit  Medication Sig Dispense Refill  . carvedilol (COREG) 6.25 MG tablet Take 6.25 mg by mouth 2 (two) times daily with a meal.    . lisinopril (ZESTRIL) 40 MG tablet TAKE 1 TABLET BY MOUTH ONCE DAILY. (Patient taking differently: Take 40 mg by mouth daily.) 90 tablet 1  . prasugrel (EFFIENT) 10 MG TABS tablet Take 10 mg by mouth daily.     . rosuvastatin (CRESTOR) 20 MG tablet Take 20 mg by mouth at bedtime.      No current facility-administered medications on file prior to visit.   Allergies  Allergen Reactions  . No Known Allergies    Social History   Socioeconomic History  . Marital status: Legally Separated    Spouse name: Not on file  . Number of children: Not on file  . Years of education: Not on file  . Highest education level: Not on file  Occupational History  . Not on file  Tobacco Use  . Smoking status: Current Every Day Smoker    Packs/day: 0.50  . Smokeless tobacco: Never Used  Substance and Sexual Activity  . Alcohol use: No  . Drug use: No  . Sexual activity: Not on file  Other Topics Concern  . Not on file  Social History Narrative  .  Not on file   Social Determinants of Health   Financial Resource Strain: Not on file  Food Insecurity: Not on file  Transportation Needs: Not on file  Physical Activity: Not on file  Stress: Not on file  Social Connections: Not on file  Intimate Partner Violence: Not on file   Family History  Problem Relation Age of Onset  . Mental illness Brother   . Heart disease Mother   . COPD Father   . Heart disease Sister   . Heart disease Sister      Review of Systems  All other systems reviewed and are negative.      Objective:   Physical Exam Vitals reviewed.  Constitutional:      General: He is not in acute distress.    Appearance: He is underweight. He is not ill-appearing, toxic-appearing or diaphoretic.  HENT:     Head: Normocephalic and atraumatic.      Right Ear: External ear normal.     Left Ear: External ear normal.     Nose: Nose normal.     Mouth/Throat:     Pharynx: No oropharyngeal exudate.  Eyes:     General: No scleral icterus.       Right eye: No discharge.        Left eye: No discharge.     Conjunctiva/sclera: Conjunctivae normal.     Pupils: Pupils are equal, round, and reactive to light.  Neck:     Thyroid: No thyromegaly.     Vascular: No JVD.     Trachea: No tracheal deviation.  Cardiovascular:     Rate and Rhythm: Normal rate and regular rhythm.     Heart sounds: No murmur heard. No friction rub. No gallop.   Pulmonary:     Effort: Pulmonary effort is normal. No respiratory distress.     Breath sounds: No stridor. Decreased breath sounds and wheezing present. No rhonchi or rales.  Chest:     Chest wall: No tenderness.  Abdominal:     General: Bowel sounds are normal. There is no distension.     Palpations: Abdomen is soft. There is no mass.     Tenderness: There is no abdominal tenderness. There is no guarding or rebound.  Musculoskeletal:        General: No tenderness. Normal range of motion.     Cervical back: Normal range of motion and neck supple.  Lymphadenopathy:     Cervical: No cervical adenopathy.  Skin:    General: Skin is warm.     Coloration: Skin is not pale.     Findings: No erythema or rash.  Neurological:     Mental Status: He is alert and oriented to person, place, and time.     Cranial Nerves: No cranial nerve deficit.     Motor: No abnormal muscle tone.     Coordination: Coordination normal.     Deep Tendon Reflexes: Reflexes are normal and symmetric.  Psychiatric:        Behavior: Behavior normal.        Thought Content: Thought content normal.        Judgment: Judgment normal.           Assessment & Plan:  Pulmonary emphysema, unspecified emphysema type ([place])  As we discussed at his physical this summer I believe he would benefit from taking Anoro.  Therefore I will  start him on Anoro 1 inhalation daily.  I encouraged him to use albuterol more sparingly 2  puffs every 6 hours as needed rather than as a maintenance medicine.  I strongly, strongly encouraged him to quit smoking and explained the rationale.  Reassess in 1 month.

## 2019-12-29 ENCOUNTER — Other Ambulatory Visit: Payer: Self-pay | Admitting: Family Medicine

## 2020-08-29 ENCOUNTER — Other Ambulatory Visit: Payer: Medicare Other

## 2020-08-29 ENCOUNTER — Other Ambulatory Visit: Payer: Self-pay

## 2020-08-29 DIAGNOSIS — I10 Essential (primary) hypertension: Secondary | ICD-10-CM

## 2020-08-29 DIAGNOSIS — Z13220 Encounter for screening for lipoid disorders: Secondary | ICD-10-CM

## 2020-08-29 DIAGNOSIS — Z0000 Encounter for general adult medical examination without abnormal findings: Secondary | ICD-10-CM

## 2020-08-29 DIAGNOSIS — E7800 Pure hypercholesterolemia, unspecified: Secondary | ICD-10-CM

## 2020-08-29 DIAGNOSIS — I252 Old myocardial infarction: Secondary | ICD-10-CM

## 2020-08-29 DIAGNOSIS — Z1159 Encounter for screening for other viral diseases: Secondary | ICD-10-CM

## 2020-08-29 DIAGNOSIS — Z125 Encounter for screening for malignant neoplasm of prostate: Secondary | ICD-10-CM

## 2020-08-30 LAB — COMPLETE METABOLIC PANEL WITH GFR
AG Ratio: 1.5 (calc) (ref 1.0–2.5)
ALT: 9 U/L (ref 9–46)
AST: 17 U/L (ref 10–35)
Albumin: 3.8 g/dL (ref 3.6–5.1)
Alkaline phosphatase (APISO): 63 U/L (ref 35–144)
BUN: 17 mg/dL (ref 7–25)
CO2: 27 mmol/L (ref 20–32)
Calcium: 9 mg/dL (ref 8.6–10.3)
Chloride: 107 mmol/L (ref 98–110)
Creat: 1.16 mg/dL (ref 0.70–1.28)
Globulin: 2.6 g/dL (calc) (ref 1.9–3.7)
Glucose, Bld: 95 mg/dL (ref 65–99)
Potassium: 3.9 mmol/L (ref 3.5–5.3)
Sodium: 142 mmol/L (ref 135–146)
Total Bilirubin: 0.3 mg/dL (ref 0.2–1.2)
Total Protein: 6.4 g/dL (ref 6.1–8.1)
eGFR: 64 mL/min/{1.73_m2} (ref >=60)

## 2020-08-30 LAB — CBC WITH DIFFERENTIAL/PLATELET
Absolute Monocytes: 509 cells/uL (ref 200–950)
Basophils Absolute: 38 cells/uL (ref 0–200)
Basophils Relative: 0.8 %
Eosinophils Absolute: 456 cells/uL (ref 15–500)
Eosinophils Relative: 9.5 %
HCT: 36.9 % — ABNORMAL LOW (ref 38.5–50.0)
Hemoglobin: 12 g/dL — ABNORMAL LOW (ref 13.2–17.1)
Lymphs Abs: 1402 cells/uL (ref 850–3900)
MCH: 28.8 pg (ref 27.0–33.0)
MCHC: 32.5 g/dL (ref 32.0–36.0)
MCV: 88.7 fL (ref 80.0–100.0)
MPV: 9.5 fL (ref 7.5–12.5)
Monocytes Relative: 10.6 %
Neutro Abs: 2395 cells/uL (ref 1500–7800)
Neutrophils Relative %: 49.9 %
Platelets: 205 10*3/uL (ref 140–400)
RBC: 4.16 10*6/uL — ABNORMAL LOW (ref 4.20–5.80)
RDW: 14.1 % (ref 11.0–15.0)
Total Lymphocyte: 29.2 %
WBC: 4.8 10*3/uL (ref 3.8–10.8)

## 2020-08-30 LAB — LIPID PANEL
Cholesterol: 121 mg/dL (ref <200)
HDL: 47 mg/dL (ref >=40)
LDL Cholesterol (Calc): 60 mg/dL (calc)
Non-HDL Cholesterol (Calc): 74 mg/dL (calc) (ref <130)
Total CHOL/HDL Ratio: 2.6 (calc) (ref <5.0)
Triglycerides: 64 mg/dL (ref <150)

## 2020-08-30 LAB — HEPATITIS C ANTIBODY
Hepatitis C Ab: NONREACTIVE
SIGNAL TO CUT-OFF: 0.01 (ref <1.00)

## 2020-08-30 LAB — PSA: PSA: 2.27 ng/mL (ref <=4.00)

## 2020-08-31 ENCOUNTER — Encounter: Payer: Self-pay | Admitting: Family Medicine

## 2020-08-31 ENCOUNTER — Ambulatory Visit (INDEPENDENT_AMBULATORY_CARE_PROVIDER_SITE_OTHER): Payer: Medicare Other | Admitting: Family Medicine

## 2020-08-31 ENCOUNTER — Other Ambulatory Visit: Payer: Self-pay

## 2020-08-31 VITALS — BP 130/68 | HR 66 | Temp 98.4°F | Resp 16 | Ht 70.0 in | Wt 143.0 lb

## 2020-08-31 DIAGNOSIS — E7800 Pure hypercholesterolemia, unspecified: Secondary | ICD-10-CM

## 2020-08-31 DIAGNOSIS — Z125 Encounter for screening for malignant neoplasm of prostate: Secondary | ICD-10-CM

## 2020-08-31 DIAGNOSIS — I10 Essential (primary) hypertension: Secondary | ICD-10-CM

## 2020-08-31 DIAGNOSIS — Z0001 Encounter for general adult medical examination with abnormal findings: Secondary | ICD-10-CM

## 2020-08-31 DIAGNOSIS — I252 Old myocardial infarction: Secondary | ICD-10-CM

## 2020-08-31 DIAGNOSIS — Z0000 Encounter for general adult medical examination without abnormal findings: Secondary | ICD-10-CM

## 2020-08-31 DIAGNOSIS — J439 Emphysema, unspecified: Secondary | ICD-10-CM

## 2020-08-31 DIAGNOSIS — F17200 Nicotine dependence, unspecified, uncomplicated: Secondary | ICD-10-CM

## 2020-08-31 MED ORDER — ALBUTEROL SULFATE HFA 108 (90 BASE) MCG/ACT IN AERS: 2.0000 | INHALATION_SPRAY | Freq: Four times a day (QID) | RESPIRATORY_TRACT | 4 refills | Status: DC | PRN

## 2020-08-31 NOTE — Progress Notes (Signed)
Subjective:    Patient ID: Chad Clements, male    DOB: 05-24-1940, 80 y.o.   MRN: [id number]  HPI  Patient is a very pleasant 80 year old African-American gentleman who is here today for complete physical exam.  Due to his age, he does not require colonoscopy any longer.  I reviewed his immunization records.  His pneumonia vaccines are up-to-date.  He has not had Shingrix so I recommended that he check on this at his local pharmacy.  He is due for a flu shot and I recommended he return to get a flu shot as soon as they become available.  The remainder of his immunizations are up-to-date.  He is due for lung cancer screening.  We discussed this at length today.  He is hesitant to want to proceed at this point given his advanced age.  I certainly understand that.  I recommended that he discuss this with his daughter and then let me know what he decides.  If he changes his mind I will be happy to schedule him for a CT scan of his lungs.  Unfortunately he continues to smoke and has no desire to quit at the present time.  He is using the Anoro on a daily basis and uses albuterol as needed for breakthroughs however he states that he has not needed it in quite some time so the Anoro seems to be helping.  Otherwise he is doing well and his most recent lab work is listed below and is outstanding Lab on 08/29/2020  Component Date Value Ref Range Status   WBC 08/29/2020 4.8  3.8 - 10.8 Thousand/uL Final   RBC 08/29/2020 4.16 (A) 4.20 - 5.80 Million/uL Final   Hemoglobin 08/29/2020 12.0 (A) 13.2 - 17.1 g/dL Final   HCT 08/29/2020 36.9 (A) 38.5 - 50.0 % Final   MCV 08/29/2020 88.7  80.0 - 100.0 fL Final   MCH 08/29/2020 28.8  27.0 - 33.0 pg Final   MCHC 08/29/2020 32.5  32.0 - 36.0 g/dL Final   RDW 08/29/2020 14.1  11.0 - 15.0 % Final   Platelets 08/29/2020 205  140 - 400 Thousand/uL Final   MPV 08/29/2020 9.5  7.5 - 12.5 fL Final   Neutro Abs 08/29/2020 2,395  1,500 - 7,800 cells/uL Final   Lymphs Abs  08/29/2020 1,402  850 - 3,900 cells/uL Final   Absolute Monocytes 08/29/2020 509  200 - 950 cells/uL Final   Eosinophils Absolute 08/29/2020 456  15 - 500 cells/uL Final   Basophils Absolute 08/29/2020 38  0 - 200 cells/uL Final   Neutrophils Relative % 08/29/2020 49.9  % Final   Total Lymphocyte 08/29/2020 29.2  % Final   Monocytes Relative 08/29/2020 10.6  % Final   Eosinophils Relative 08/29/2020 9.5  % Final   Basophils Relative 08/29/2020 0.8  % Final   Glucose, Bld 08/29/2020 95  65 - 99 mg/dL Final   Comment: .            Fasting reference interval .    BUN 08/29/2020 17  7 - 25 mg/dL Final   Creat 08/29/2020 1.16  0.70 - 1.28 mg/dL Final   eGFR 08/29/2020 64  > OR = 60 mL/min/1.39m Final   Comment: The eGFR is based on the CKD-EPI 2021 equation. To calculate  the new eGFR from a previous Creatinine or Cystatin C result, go to https://www.kidney.org/professionals/ kdoqi/gfr%5Fcalculator    BUN/Creatinine Ratio 035/32/9924NOT APPLICABLE  6 - 22 (calc) Final   Sodium 08/29/2020  142  135 - 146 mmol/L Final   Potassium 08/29/2020 3.9  3.5 - 5.3 mmol/L Final   Chloride 08/29/2020 107  98 - 110 mmol/L Final   CO2 08/29/2020 27  20 - 32 mmol/L Final   Calcium 08/29/2020 9.0  8.6 - 10.3 mg/dL Final   Total Protein 08/29/2020 6.4  6.1 - 8.1 g/dL Final   Albumin 08/29/2020 3.8  3.6 - 5.1 g/dL Final   Globulin 08/29/2020 2.6  1.9 - 3.7 g/dL (calc) Final   AG Ratio 08/29/2020 1.5  1.0 - 2.5 (calc) Final   Total Bilirubin 08/29/2020 0.3  0.2 - 1.2 mg/dL Final   Alkaline phosphatase (APISO) 08/29/2020 63  35 - 144 U/L Final   AST 08/29/2020 17  10 - 35 U/L Final   ALT 08/29/2020 9  9 - 46 U/L Final   Hepatitis C Ab 08/29/2020 NON-REACTIVE  NON-REACTIVE Final   SIGNAL TO CUT-OFF 08/29/2020 0.01  <1.00 Final   Comment: . HCV antibody was non-reactive. There is no laboratory  evidence of HCV infection. . In most cases, no further action is required. However, if recent HCV exposure  is suspected, a test for HCV RNA (test code [phone]) is suggested. . For additional information please refer to http://education.questdiagnostics.com/faq/FAQ22v1 (This link is being provided for informational/ educational purposes only.) .    Cholesterol 08/29/2020 121  <200 mg/dL Final   HDL 08/29/2020 47  > OR = 40 mg/dL Final   Triglycerides 08/29/2020 64  <150 mg/dL Final   LDL Cholesterol (Calc) 08/29/2020 60  mg/dL (calc) Final   Comment: Reference range: <100 . Desirable range <100 mg/dL for primary prevention;   <70 mg/dL for patients with CHD or diabetic patients  with > or = 2 CHD risk factors. Marland Kitchen LDL-C is now calculated using the Martin-Hopkins  calculation, which is a validated novel method providing  better accuracy than the Friedewald equation in the  estimation of LDL-C.  Cresenciano Genre et al. Annamaria Helling. 5400;867(61): 2061-2068  (http://education.[facility].com/faq/FAQ164)    Total CHOL/HDL Ratio 08/29/2020 2.6  <5.0 (calc) Final   Non-HDL Cholesterol (Calc) 08/29/2020 74  <130 mg/dL (calc) Final   Comment: For patients with diabetes plus 1 major ASCVD risk  factor, treating to a non-HDL-C goal of <100 mg/dL  (LDL-C of <70 mg/dL) is considered a therapeutic  option.    PSA 08/29/2020 2.27  < OR = 4.00 ng/mL Final   Comment: The total PSA value from this assay system is  standardized against the WHO standard. The test  result will be approximately 20% lower when compared  to the equimolar-standardized total PSA (Beckman  Coulter). Comparison of serial PSA results should be  interpreted with this fact in mind. . This test was performed using the Siemens  chemiluminescent method. Values obtained from  different assay methods cannot be used interchangeably. PSA levels, regardless of value, should not be interpreted as absolute evidence of the presence or absence of disease.      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 Dr. Orpah Greek   Shortness of  breath dyspnea    Smoker    Past Surgical History:  Procedure Laterality Date   CARDIAC CATHETERIZATION     heart stents     HEMATOMA EVACUATION  08/22/2015   Procedure: EVACUATION HEMATOMA;  Surgeon: Jerrell Belfast, MD;  Location: [facility];  Service: ENT;;   PAROTIDECTOMY Right 08/22/2015   Procedure: RIGHT PAROTID MASS;  Surgeon: Jerrell Belfast, MD;  Location: [facility];  Service: ENT;  Laterality: Right;   TONSILLECTOMY     Current Outpatient Medications on File Prior to Visit  Medication Sig Dispense Refill   albuterol (VENTOLIN HFA) 108 (90 Base) MCG/ACT inhaler Inhale 2 puffs into the lungs every 6 (six) hours as needed for wheezing or shortness of breath. 18 g 3   carvedilol (COREG) 6.25 MG tablet Take 6.25 mg by mouth 2 (two) times daily with a meal.     lisinopril (ZESTRIL) 20 MG tablet Take 20 mg by mouth daily.     prasugrel (EFFIENT) 10 MG TABS tablet Take 10 mg by mouth daily.      rosuvastatin (CRESTOR) 20 MG tablet Take 20 mg by mouth at bedtime.      umeclidinium-vilanterol (ANORO ELLIPTA) 62.5-25 MCG/INH AEPB Inhale 1 puff into the lungs daily. 1 each 11   No current facility-administered medications on file prior to visit.   Allergies  Allergen Reactions   No Known Allergies    Social History   Socioeconomic History   Marital status: Legally Separated    Spouse name: Not on file   Number of children: Not on file   Years of education: Not on file   Highest education level: Not on file  Occupational History   Not on file  Tobacco Use   Smoking status: Every Day    Packs/day: 0.50    Types: Cigarettes   Smokeless tobacco:  Never  Substance and Sexual Activity   Alcohol use: No   Drug use: No   Sexual activity: Not on file  Other Topics Concern   Not on file  Social History Narrative   Not on file   Social Determinants of Health   Financial Resource Strain: Not on file  Food Insecurity: Not on file  Transportation Needs: Not on file  Physical Activity: Not on file  Stress: Not on file  Social Connections: Not on file  Intimate Partner Violence: Not on file   Family History  Problem Relation Age of Onset   Mental illness Brother    Heart disease Mother    COPD Father    Heart disease Sister    Heart disease Sister      Review of Systems  All other systems reviewed and are negative.     Objective:   Physical Exam Vitals reviewed.  Constitutional:      General: He is not in acute distress.    Appearance: He is underweight. He is not ill-appearing, toxic-appearing or diaphoretic.  HENT:     Head: Normocephalic and atraumatic.     Right Ear: External ear normal.     Left Ear: External ear normal.     Nose: Nose normal.     Mouth/Throat:     Pharynx: No oropharyngeal exudate.  Eyes:     General: No scleral icterus.       Right eye: No discharge.        Left eye: No discharge.     Conjunctiva/sclera: Conjunctivae normal.     Pupils: Pupils are equal, round, and reactive to light.  Neck:     Thyroid: No thyromegaly.     Vascular: No JVD.     Trachea: No tracheal deviation.  Cardiovascular:     Rate and Rhythm: Normal rate and regular rhythm.     Heart sounds: No murmur heard.   No friction rub. No gallop.  Pulmonary:     Effort: Pulmonary effort is normal. No respiratory distress.     Breath sounds: No stridor. Decreased breath sounds and wheezing present. No rhonchi or rales.  Chest:     Chest wall: No tenderness.  Abdominal:     General: Bowel sounds are normal. There is no distension.     Palpations: Abdomen is soft. There is no mass.     Tenderness: There is no abdominal  tenderness. There is no guarding or rebound.  Musculoskeletal:        General: No tenderness. Normal range of motion.     Cervical back: Normal range of motion and neck supple.  Lymphadenopathy:     Cervical: No cervical adenopathy.  Skin:    General: Skin is warm.     Coloration: Skin is not pale.     Findings: No erythema or rash.  Neurological:     Mental Status: He is alert and oriented to person, place, and time.     Cranial Nerves: No cranial nerve deficit.     Motor: No abnormal muscle tone.     Coordination: Coordination normal.     Deep Tendon Reflexes: Reflexes are normal and symmetric.  Psychiatric:        Behavior: Behavior normal.        Thought Content: Thought content normal.        Judgment: Judgment normal.        Assessment & Plan:  General medical exam  Prostate cancer screening  Essential hypertension  Pure hypercholesterolemia  Smoker  History of MI (myocardial infarction)  Pulmonary emphysema, unspecified emphysema type ([place]) Biggest concern for this patient is a smoking.  Strongly encourage smoking cessation.  He denies any falls, memory loss, or depression.  Blood pressure today is excellent.  Lab work is outstanding.  LDL cholesterol is below 70.  Sugar and liver and kidney test are normal.  CBC is stable and PSA is below 4.  He does not require colonoscopy.  We discussed lung cancer screening with a CT scan and I encouraged the patient to discuss this with his family and then let me know what his decisions are.  However at the present time he has decided not to pursue lung cancer screening.  I refilled his albuterol which he uses sparingly

## 2020-10-29 ENCOUNTER — Other Ambulatory Visit: Payer: Self-pay | Admitting: Family Medicine

## 2020-12-17 ENCOUNTER — Other Ambulatory Visit: Payer: Self-pay | Admitting: Family Medicine

## 2020-12-18 ENCOUNTER — Telehealth: Payer: Self-pay

## 2020-12-18 ENCOUNTER — Ambulatory Visit: Payer: Medicare Other | Admitting: Family Medicine

## 2020-12-18 ENCOUNTER — Other Ambulatory Visit: Payer: Self-pay

## 2020-12-18 NOTE — Telephone Encounter (Signed)
Pt called in requesting a refill for ANORO ELLIPTA 62.5-25 MCG/ACT. Pt states that he is down to his last use (puff). Pt asks if this med could be called in asap please.  Cb#: [phone]

## 2020-12-20 ENCOUNTER — Emergency Department (HOSPITAL_COMMUNITY)
Admission: EM | Admit: 2020-12-20 | Discharge: 2020-12-20 | Disposition: A | Discharge status: Home / Self Care | Payer: Medicare Other | Attending: Emergency Medicine | Admitting: Emergency Medicine

## 2020-12-20 ENCOUNTER — Emergency Department (HOSPITAL_COMMUNITY): Payer: Medicare Other

## 2020-12-20 ENCOUNTER — Encounter (HOSPITAL_COMMUNITY): Payer: Self-pay | Admitting: Emergency Medicine

## 2020-12-20 ENCOUNTER — Other Ambulatory Visit: Payer: Self-pay

## 2020-12-20 DIAGNOSIS — I2510 Atherosclerotic heart disease of native coronary artery without angina pectoris: Secondary | ICD-10-CM | POA: Insufficient documentation

## 2020-12-20 DIAGNOSIS — Z86018 Personal history of other benign neoplasm: Secondary | ICD-10-CM | POA: Diagnosis not present

## 2020-12-20 DIAGNOSIS — Z79899 Other long term (current) drug therapy: Secondary | ICD-10-CM | POA: Insufficient documentation

## 2020-12-20 DIAGNOSIS — R0602 Shortness of breath: Secondary | ICD-10-CM | POA: Diagnosis present

## 2020-12-20 DIAGNOSIS — Z20822 Contact with and (suspected) exposure to covid-19: Secondary | ICD-10-CM | POA: Insufficient documentation

## 2020-12-20 DIAGNOSIS — Z85118 Personal history of other malignant neoplasm of bronchus and lung: Secondary | ICD-10-CM | POA: Insufficient documentation

## 2020-12-20 DIAGNOSIS — F17210 Nicotine dependence, cigarettes, uncomplicated: Secondary | ICD-10-CM | POA: Diagnosis not present

## 2020-12-20 DIAGNOSIS — I10 Essential (primary) hypertension: Secondary | ICD-10-CM | POA: Insufficient documentation

## 2020-12-20 DIAGNOSIS — J441 Chronic obstructive pulmonary disease with (acute) exacerbation: Secondary | ICD-10-CM | POA: Diagnosis not present

## 2020-12-20 LAB — COMPREHENSIVE METABOLIC PANEL
ALT: 20 U/L (ref 0–44)
AST: 21 U/L (ref 15–41)
Albumin: 3.9 g/dL (ref 3.5–5.0)
Alkaline Phosphatase: 65 U/L (ref 38–126)
Anion gap: 10 (ref 5–15)
BUN: 26 mg/dL — ABNORMAL HIGH (ref 8–23)
CO2: 28 mmol/L (ref 22–32)
Calcium: 9.4 mg/dL (ref 8.9–10.3)
Chloride: 99 mmol/L (ref 98–111)
Creatinine, Ser: 1.23 mg/dL (ref 0.61–1.24)
GFR, Estimated: 60 mL/min — ABNORMAL LOW (ref >60)
Glucose, Bld: 105 mg/dL — ABNORMAL HIGH (ref 70–99)
Potassium: 4.4 mmol/L (ref 3.5–5.1)
Sodium: 137 mmol/L (ref 135–145)
Total Bilirubin: 0.5 mg/dL (ref 0.3–1.2)
Total Protein: 7.5 g/dL (ref 6.5–8.1)

## 2020-12-20 LAB — TROPONIN I (HIGH SENSITIVITY)
Troponin I (High Sensitivity): 16 ng/L (ref <18)
Troponin I (High Sensitivity): 17 ng/L (ref <18)

## 2020-12-20 LAB — CBC WITH DIFFERENTIAL/PLATELET
Abs Immature Granulocytes: 0.01 10*3/uL (ref 0.00–0.07)
Basophils Absolute: 0 10*3/uL (ref 0.0–0.1)
Basophils Relative: 1 %
Eosinophils Absolute: 0.4 10*3/uL (ref 0.0–0.5)
Eosinophils Relative: 9 %
HCT: 40.8 % (ref 39.0–52.0)
Hemoglobin: 13.2 g/dL (ref 13.0–17.0)
Immature Granulocytes: 0 %
Lymphocytes Relative: 21 %
Lymphs Abs: 1 10*3/uL (ref 0.7–4.0)
MCH: 29.3 pg (ref 26.0–34.0)
MCHC: 32.4 g/dL (ref 30.0–36.0)
MCV: 90.5 fL (ref 80.0–100.0)
Monocytes Absolute: 0.4 10*3/uL (ref 0.1–1.0)
Monocytes Relative: 9 %
Neutro Abs: 2.7 10*3/uL (ref 1.7–7.7)
Neutrophils Relative %: 60 %
Platelets: 215 10*3/uL (ref 150–400)
RBC: 4.51 MIL/uL (ref 4.22–5.81)
RDW: 14.1 % (ref 11.5–15.5)
WBC: 4.6 10*3/uL (ref 4.0–10.5)
nRBC: 0 % (ref 0.0–0.2)

## 2020-12-20 LAB — RESP PANEL BY RT-PCR (FLU A&B, COVID) ARPGX2
Influenza A by PCR: NEGATIVE
Influenza B by PCR: NEGATIVE
SARS Coronavirus 2 by RT PCR: NEGATIVE

## 2020-12-20 LAB — BRAIN NATRIURETIC PEPTIDE: B Natriuretic Peptide: 42 pg/mL (ref 0.0–100.0)

## 2020-12-20 MED ORDER — AZITHROMYCIN 250 MG PO TABS: ORAL_TABLET | ORAL | 0 refills | Status: DC

## 2020-12-20 MED ORDER — METHYLPREDNISOLONE SODIUM SUCC 125 MG IJ SOLR
125.0000 mg | Freq: Once | INTRAMUSCULAR | Status: AC
  Administered 2020-12-20: 125 mg via INTRAVENOUS
  Filled 2020-12-20: qty 2

## 2020-12-20 MED ORDER — ALBUTEROL SULFATE (2.5 MG/3ML) 0.083% IN NEBU
2.5000 mg | INHALATION_SOLUTION | Freq: Once | RESPIRATORY_TRACT | Status: AC
  Administered 2020-12-20: 2.5 mg via RESPIRATORY_TRACT
  Filled 2020-12-20: qty 3

## 2020-12-20 MED ORDER — MAGNESIUM SULFATE 2 GM/50ML IV SOLN
2.0000 g | Freq: Once | INTRAVENOUS | Status: AC
  Administered 2020-12-20: 2 g via INTRAVENOUS
  Filled 2020-12-20: qty 50

## 2020-12-20 MED ORDER — IPRATROPIUM-ALBUTEROL 0.5-2.5 (3) MG/3ML IN SOLN
3.0000 mL | Freq: Once | RESPIRATORY_TRACT | Status: AC
  Administered 2020-12-20: 3 mL via RESPIRATORY_TRACT
  Filled 2020-12-20: qty 3

## 2020-12-20 MED ORDER — PREDNISONE 10 MG PO TABS: 20.0000 mg | ORAL_TABLET | Freq: Every day | ORAL | 0 refills | Status: DC

## 2020-12-20 MED ORDER — IOHEXOL 300 MG/ML  SOLN
75.0000 mL | Freq: Once | INTRAMUSCULAR | Status: AC | PRN
  Administered 2020-12-20: 75 mL via INTRAVENOUS

## 2020-12-20 MED ORDER — ALBUTEROL SULFATE HFA 108 (90 BASE) MCG/ACT IN AERS: 2.0000 | INHALATION_SPRAY | RESPIRATORY_TRACT | 0 refills | Status: DC | PRN

## 2020-12-20 NOTE — ED Notes (Signed)
Respiratory contacted for nebulizer treatment.

## 2020-12-20 NOTE — ED Provider Notes (Signed)
[facility] EMERGENCY DEPARTMENT Provider Note   CSN: [id number] Arrival date & time: 12/20/20  0800     History Chief Complaint  Patient presents with   Shortness of Breath    Chad Clements is a 80 y.o. male.  Patient with cough and shortness of breath.  Patient has significant history of COPD.  The history is provided by the patient and medical records. No language interpreter was used.  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 Dr. Orpah Greek   Shortness of breath dyspnea    Smoker     Patient Active Problem List   Diagnosis Date Noted   Parotid mass 08/22/2015   MI (myocardial infarction) ([place])    Hyperlipidemia    Hypertension    Smoker     Past Surgical History:  Procedure Laterality Date   CARDIAC CATHETERIZATION     heart stents     HEMATOMA EVACUATION  08/22/2015   Procedure: EVACUATION HEMATOMA;  Surgeon: Jerrell Belfast, MD;  Location: [facility];  Service: ENT;;   PAROTIDECTOMY Right 08/22/2015   Procedure: RIGHT PAROTID MASS;  Surgeon: Jerrell Belfast, MD;  Location: [facility] OR;  Service: ENT;  Laterality: Right;   TONSILLECTOMY         Family History  Problem Relation Age of Onset   Mental illness Brother    Heart disease Mother    COPD Father    Heart disease Sister    Heart disease Sister      Social History   Tobacco Use   Smoking status: Every Day    Packs/day: 0.50    Types: Cigarettes   Smokeless tobacco: Never  Vaping Use   Vaping Use: Never used  Substance Use Topics   Alcohol use: No   Drug use: No    Home Medications Prior to Admission medications   Medication Sig Start Date End Date Taking? Authorizing Provider  albuterol (VENTOLIN HFA) 108 (90 Base) MCG/ACT inhaler Inhale 2 puffs into the lungs every 4 (four) hours as needed for wheezing or shortness of breath. 12/20/20  Yes Milton Ferguson, MD  azithromycin (ZITHROMAX Z-PAK) 250 MG tablet 2 po day one, then 1 daily x 4 days 12/20/20  Yes Milton Ferguson, MD  predniSONE (DELTASONE) 10 MG tablet Take 2 tablets (20 mg total) by mouth daily. 12/20/20  Yes Milton Ferguson, MD  ANORO ELLIPTA 62.5-25 MCG/ACT AEPB INHALE 1 PUFF INTO THE LUNGS DAILY. 12/17/20   Susy Frizzle, MD  carvedilol (COREG) 6.25 MG tablet Take 6.25 mg by mouth 2 (two) times daily with a meal.    [provider]  lisinopril (ZESTRIL) 20 MG tablet Take 20 mg by mouth daily. 04/13/20   [provider]  prasugrel (EFFIENT) 10 MG TABS tablet Take  10 mg by mouth daily.  08/16/18   [provider]  rosuvastatin (CRESTOR) 20 MG tablet Take 20 mg by mouth at bedtime.  08/16/18   [provider]    Allergies    No known allergies  Review of Systems   Review of Systems  Constitutional:  Negative for appetite change and fatigue.  HENT:  Negative for congestion, ear discharge and sinus pressure.   Eyes:  Negative for discharge.  Respiratory:  Positive for shortness of breath. Negative for cough.   Cardiovascular:  Negative for chest pain.  Gastrointestinal:  Negative for abdominal pain and diarrhea.  Genitourinary:  Negative for frequency and hematuria.  Musculoskeletal:  Negative for back pain.  Skin:  Negative for rash.  Neurological:  Negative for seizures and headaches.  Psychiatric/Behavioral:  Negative  for hallucinations.    Physical Exam Updated Vital Signs BP (!) 159/86    Pulse 76    Temp 97.7 F (36.5 C) (Oral)    Resp 18    Ht 5\' 10"  (1.778 m)    Wt 64.9 kg    SpO2 100%    BMI 20.53 kg/m   Physical Exam Vitals and nursing note reviewed.  Constitutional:      Appearance: He is well-developed.  HENT:     Head: Normocephalic.     Nose: Nose normal.  Eyes:     General: No scleral icterus.    Conjunctiva/sclera: Conjunctivae normal.  Neck:     Thyroid: No thyromegaly.  Cardiovascular:     Rate and Rhythm: Normal rate and regular rhythm.     Heart sounds: No murmur heard.   No friction rub. No gallop.  Pulmonary:     Breath sounds: No stridor. Wheezing present. No rales.  Chest:     Chest wall: No tenderness.  Abdominal:     General: There is no distension.     Tenderness: There is no abdominal tenderness. There is no rebound.  Musculoskeletal:        General: Normal range of motion.     Cervical back: Neck supple.  Lymphadenopathy:     Cervical: No cervical adenopathy.  Skin:    Findings: No erythema or rash.  Neurological:     Mental Status: He is alert and oriented to person, place, and time.     Motor: No abnormal muscle tone.     Coordination: Coordination normal.  Psychiatric:        Behavior: Behavior normal.    ED Results / Procedures / Treatments   Labs (all labs ordered are listed, but only abnormal results are displayed) Labs Reviewed  COMPREHENSIVE METABOLIC PANEL - Abnormal; Notable for the following components:      Result Value   Glucose, Bld 105 (*)    BUN 26 (*)    GFR, Estimated 60 (*)    All other components within normal limits  RESP PANEL BY RT-PCR (FLU A&B, COVID) ARPGX2  CBC WITH DIFFERENTIAL/PLATELET  BRAIN NATRIURETIC PEPTIDE  TROPONIN I (HIGH SENSITIVITY)  TROPONIN I (HIGH SENSITIVITY)    EKG EKG Interpretation  Date/Time:  Thursday December 20 2020 08:18:02 EST Ventricular Rate:  63 PR Interval:  169 QRS Duration: 96 QT  Interval:  375 QTC Calculation: 387 R Axis:   71 Text Interpretation: Sinus rhythm Nonspecific T abnormalities, lateral leads Minimal ST elevation, inferior leads Baseline wander in lead(s) V3 Confirmed by Milton Ferguson [phone]) on 12/20/2020 11:31:30 AM  Radiology CT Chest W Contrast  Result Date: 12/20/2020 CLINICAL  DATA:  Lung nodule EXAM: CT CHEST WITH CONTRAST TECHNIQUE: Multidetector CT imaging of the chest was performed during intravenous contrast administration. CONTRAST:  14mL OMNIPAQUE IOHEXOL 300 MG/ML  SOLN COMPARISON:  Radiograph dated December 20, 2020 FINDINGS: Cardiovascular: Normal heart size. No pericardial effusion. Three-vessel coronary artery calcifications. Atherosclerotic disease of the thoracic aorta. No suspicious filling defects of the central pulmonary arteries. Mediastinum/Nodes: Patulous esophagus. Thyroid is unremarkable. No pathologically enlarged lymph nodes seen in the chest. Lungs/Pleura: Central airways are patent. Small amount of the is debris noted in the distal trachea. Focal left lower lobe bronchiectasis with associated bronchial wall thickening and mucoid impaction. Solid left lower lobe pulmonary nodule measuring 4 mm on series 4, image 138. Upper Abdomen: Simple cysts of the right kidney. No acute abnormality. Musculoskeletal: No chest wall abnormality. No acute or significant osseous findings. Cutaneous lesion of the left upper chest measuring 1.1 cm on series 2, image 50. IMPRESSION: 1. No evidence of left upper lobe pulmonary nodule. Nodule which is previously seen on chest x-ray favored to be due overlying skin lesion of the left upper chest. 2. Focal left lower lobe bronchiectasis with associated bronchial wall thickening and mucoid impaction, findings are likely due to chronic infection or aspiration. 3. Solid left lower lobe pulmonary nodule measuring 4 mm. No follow-up needed if patient is low-risk. Non-contrast chest CT can be considered in 12 months if  patient is high-risk. This recommendation follows the consensus statement: Guidelines for Management of Incidental Pulmonary Nodules Detected on CT Images: From the Fleischner Society 2017; Radiology 2017; 284:228-243. 4. Aortic Atherosclerosis (ICD10-I70.0) and Emphysema (ICD10-J43.9). Electronically Signed   By: Yetta Glassman M.D.   On: 12/20/2020 10:30   DG Chest Port 1 View  Result Date: 12/20/2020 CLINICAL DATA:  Shortness of breath. EXAM: PORTABLE CHEST 1 VIEW COMPARISON:  12/04/2019 FINDINGS: The cardiac silhouette, mediastinal and hilar contours are within normal limits and stable. Coronary artery stent noted. The lungs demonstrate stable hyperinflation. No infiltrates edema or effusions. There is a new pulmonary nodule in the left upper lobe. Recommend chest CT for further evaluation. IMPRESSION: 1. Hyperinflation and probable emphysematous changes. No infiltrates or effusions. 2. New left upper lobe pulmonary nodule. Recommend chest CT for further evaluation. Electronically Signed   By: Marijo Sanes M.D.   On: 12/20/2020 08:31    Procedures Procedures   Medications Ordered in ED Medications  methylPREDNISolone sodium succinate (SOLU-MEDROL) 125 mg/2 mL injection 125 mg (125 mg Intravenous Given 12/20/20 0908)  ipratropium-albuterol (DUONEB) 0.5-2.5 (3) MG/3ML nebulizer solution 3 mL (3 mLs Nebulization Given 12/20/20 0902)  albuterol (PROVENTIL) (2.5 MG/3ML) 0.083% nebulizer solution 2.5 mg (2.5 mg Nebulization Given 12/20/20 0902)  magnesium sulfate IVPB 2 g 50 mL (0 g Intravenous Stopped 12/20/20 1006)  ipratropium-albuterol (DUONEB) 0.5-2.5 (3) MG/3ML nebulizer solution 3 mL (3 mLs Nebulization Given 12/20/20 1041)  albuterol (PROVENTIL) (2.5 MG/3ML) 0.083% nebulizer solution 2.5 mg (2.5 mg Nebulization Given 12/20/20 1041)  iohexol (OMNIPAQUE) 300 MG/ML solution 75 mL (75 mLs Intravenous Contrast Given 12/20/20 1008)    ED Course  I have reviewed the triage vital signs and the  nursing notes.  Pertinent labs & imaging results that were available during my care of the patient were reviewed by me and considered in my medical decision making (see chart for details). Patient with COPD exacerbation.  Patient improved with neb treatments and steroids.   MDM Rules/Calculators/A&P  Patient with COPD exacerbation he is sent home with albuterol inhaler.  Prednisone and Zithromax Final Clinical Impression(s) / ED Diagnoses Final diagnoses:  COPD exacerbation ([place])    Rx / DC Orders ED Discharge Orders          Ordered    predniSONE (DELTASONE) 10 MG tablet  Daily        12/20/20 1203    albuterol (VENTOLIN HFA) 108 (90 Base) MCG/ACT inhaler  Every 4 hours PRN        12/20/20 1203    azithromycin (ZITHROMAX Z-PAK) 250 MG tablet        12/20/20 1203             Milton Ferguson, MD 12/20/20 1204

## 2020-12-20 NOTE — ED Notes (Signed)
Unable to establish IV - blood drawn.

## 2020-12-20 NOTE — ED Notes (Signed)
Patient transported to CT 

## 2020-12-20 NOTE — Discharge Instructions (Signed)
Follow-up with your doctor next week for recheck 

## 2020-12-20 NOTE — ED Triage Notes (Signed)
Pt to the ED [facility] with shortness of breath with COPD, but does not wear oxygen at home.  Pt was given 2.5 mg of Albuterol in route.

## 2020-12-24 ENCOUNTER — Encounter: Payer: Self-pay | Admitting: Family Medicine

## 2020-12-24 ENCOUNTER — Other Ambulatory Visit: Payer: Self-pay

## 2020-12-24 ENCOUNTER — Ambulatory Visit (INDEPENDENT_AMBULATORY_CARE_PROVIDER_SITE_OTHER): Payer: Medicare Other | Admitting: Family Medicine

## 2020-12-24 VITALS — BP 108/68 | HR 73 | Resp 18 | Ht 70.0 in | Wt 134.0 lb

## 2020-12-24 DIAGNOSIS — J441 Chronic obstructive pulmonary disease with (acute) exacerbation: Secondary | ICD-10-CM

## 2020-12-24 MED ORDER — LEVOFLOXACIN 500 MG PO TABS: 500.0000 mg | ORAL_TABLET | Freq: Every day | ORAL | 0 refills | Status: AC

## 2020-12-24 MED ORDER — PREDNISONE 20 MG PO TABS: ORAL_TABLET | ORAL | 0 refills | Status: DC

## 2020-12-24 NOTE — Progress Notes (Signed)
Subjective:    Patient ID: Chad Clements, male    DOB: December 02, 1940, 80 y.o.   MRN: [id number]  HPI  Patient is a 80 year old African-American gentleman with a history of COPD.  He went to the hospital on the 15th with shortness of breath and cough and wheezing.  In the hospital, they obtained a CT scan of his chest.  The results are included below for reference.  IMPRESSION: 1. No evidence of left upper lobe pulmonary nodule. Nodule which is previously seen on chest x-ray favored to be due overlying skin lesion of the left upper chest. 2. Focal left lower lobe bronchiectasis with associated bronchial wall thickening and mucoid impaction, findings are likely due to chronic infection or aspiration. 3. Solid left lower lobe pulmonary nodule measuring 4 mm. No follow-up needed if patient is low-risk. Non-contrast chest CT can be considered in 12 months if patient is high-risk. This  He improved with nebulizers and steroids and he was discharged home with a plan to be on oral prednisone and a Z-Pak.  However the patient states that he never got the prednisone or the Z-Pak.  He is been using albuterol 2-3 times a day which helps but he continues to have shortness of breath and wheezing and cough and congestion with purulent sputum.  Today on exam he has prominent rhonchorous breath sounds in the left lower lobe as well as diminished breath sounds all throughout and wheezing on expiration.   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 Dr. Orpah Greek   Shortness of breath dyspnea    Smoker    Past Surgical History:  Procedure  Laterality Date   CARDIAC CATHETERIZATION     heart stents     HEMATOMA EVACUATION  08/22/2015   Procedure: EVACUATION HEMATOMA;  Surgeon: Jerrell Belfast, MD;  Location: [facility];  Service: ENT;;   PAROTIDECTOMY Right 08/22/2015   Procedure: RIGHT PAROTID MASS;  Surgeon: Jerrell Belfast, MD;  Location: [facility];  Service: ENT;  Laterality: Right;   TONSILLECTOMY     Current Outpatient Medications on File Prior to Visit  Medication Sig Dispense Refill   albuterol (VENTOLIN HFA) 108 (90 Base) MCG/ACT inhaler Inhale 2 puffs into the lungs every 4 (four) hours as needed for wheezing or shortness of breath. 8 g 0   ANORO ELLIPTA 62.5-25 MCG/ACT AEPB INHALE 1 PUFF INTO THE LUNGS DAILY. 60 each 0   carvedilol (COREG) 6.25 MG tablet Take 6.25 mg by mouth 2 (two) times daily with a meal.     lisinopril (ZESTRIL) 20 MG tablet Take 20 mg by mouth daily.     prasugrel (EFFIENT) 10 MG TABS tablet Take 10 mg by mouth daily.      rosuvastatin (CRESTOR) 20 MG tablet Take 20 mg by mouth at bedtime.      No current facility-administered medications on file prior to visit.   Allergies  Allergen Reactions   No Known Allergies    Social History   Socioeconomic History   Marital status: Legally Separated    Spouse name: Not on  file   Number of children: Not on file   Years of education: Not on file   Highest education level: Not on file  Occupational History   Not on file  Tobacco Use   Smoking status: Every Day    Packs/day: 0.50    Types: Cigarettes   Smokeless tobacco: Never  Vaping Use   Vaping Use: Never used  Substance and Sexual Activity   Alcohol use: No   Drug use: No   Sexual activity: Not on file  Other Topics Concern   Not on file  Social History Narrative   Not on file   Social Determinants of Health   Financial Resource Strain: Not on file  Food Insecurity: Not on file  Transportation Needs: Not on file  Physical Activity: Not on file  Stress: Not on  file  Social Connections: Not on file  Intimate Partner Violence: Not on file   Family History  Problem Relation Age of Onset   Mental illness Brother    Heart disease Mother    COPD Father    Heart disease Sister    Heart disease Sister      Review of Systems  All other systems reviewed and are negative.     Objective:   Physical Exam Vitals reviewed.  Constitutional:      General: He is not in acute distress.    Appearance: He is underweight. He is not ill-appearing, toxic-appearing or diaphoretic.  HENT:     Head: Normocephalic and atraumatic.     Right Ear: External ear normal.     Left Ear: External ear normal.     Nose: Nose normal.     Mouth/Throat:     Pharynx: No oropharyngeal exudate.  Eyes:     General: No scleral icterus.       Right eye: No discharge.        Left eye: No discharge.     Conjunctiva/sclera: Conjunctivae normal.     Pupils: Pupils are equal, round, and reactive to light.  Neck:     Thyroid: No thyromegaly.     Vascular: No JVD.     Trachea: No tracheal deviation.  Cardiovascular:     Rate and Rhythm: Normal rate and regular rhythm.     Heart sounds: No murmur heard.   No friction rub. No gallop.  Pulmonary:     Effort: Pulmonary effort is normal. No respiratory distress.     Breath sounds: Decreased air movement present. No stridor. Decreased breath sounds, wheezing and rhonchi present. No rales.    Chest:     Chest wall: No tenderness.  Abdominal:     General: Bowel sounds are normal. There is no distension.     Palpations: Abdomen is soft. There is no mass.     Tenderness: There is no abdominal tenderness. There is no guarding or rebound.  Musculoskeletal:        General: No tenderness. Normal range of motion.     Cervical back: Normal range of motion and neck supple.  Lymphadenopathy:     Cervical: No cervical adenopathy.  Skin:    General: Skin is warm.     Coloration: Skin is not pale.     Findings: No erythema or  rash.  Neurological:     Mental Status: He is alert and oriented to person, place, and time.     Cranial Nerves: No cranial nerve deficit.     Motor: No abnormal muscle tone.  Coordination: Coordination normal.     Deep Tendon Reflexes: Reflexes are normal and symmetric.  Psychiatric:        Behavior: Behavior normal.        Thought Content: Thought content normal.        Judgment: Judgment normal.          Assessment & Plan:  COPD exacerbation ([place]) Patient is having a COPD exacerbation complicated by bronchiectasis and what I feel could be a developing pneumonia in his left lower lobe.  Due to miscommunication he has not taken the antibiotics and steroids as prescribed in the emergency room.  Therefore I will start the patient on Levaquin 500 mg daily for 7 days.  Start the prednisone taper pack in addition albuterol 2 puffs inhaled every 6 hours as needed.  Follow-up later this week if no better or next week for recheck to ensure improvement in the breath sounds in the left posterior lower lobe.

## 2021-01-01 ENCOUNTER — Ambulatory Visit: Payer: Medicare Other | Admitting: Family Medicine

## 2021-01-01 ENCOUNTER — Encounter: Payer: Self-pay | Admitting: Family Medicine

## 2021-01-01 ENCOUNTER — Other Ambulatory Visit: Payer: Self-pay

## 2021-01-01 VITALS — BP 98/62 | HR 65 | Temp 97.5°F | Ht 70.0 in | Wt 137.4 lb

## 2021-01-01 DIAGNOSIS — J441 Chronic obstructive pulmonary disease with (acute) exacerbation: Secondary | ICD-10-CM | POA: Diagnosis not present

## 2021-01-01 DIAGNOSIS — F17200 Nicotine dependence, unspecified, uncomplicated: Secondary | ICD-10-CM | POA: Diagnosis not present

## 2021-01-01 MED ORDER — BREZTRI AEROSPHERE 160-9-4.8 MCG/ACT IN AERO: 2.0000 | INHALATION_SPRAY | Freq: Two times a day (BID) | RESPIRATORY_TRACT | 11 refills | Status: AC

## 2021-01-01 MED ORDER — ALBUTEROL SULFATE HFA 108 (90 BASE) MCG/ACT IN AERS: 2.0000 | INHALATION_SPRAY | RESPIRATORY_TRACT | 3 refills | Status: DC | PRN

## 2021-01-01 NOTE — Progress Notes (Signed)
Subjective:    Patient ID: Chad Clements, male    DOB: [birth year], 80 y.o.   MRN: [id number]  HPI 12/24/20 Patient is a 80 year old African-American gentleman with a history of COPD.  He went to the hospital on the 15th with shortness of breath and cough and wheezing.  In the hospital, they obtained a CT scan of his chest.  The results are included below for reference.  IMPRESSION: 1. No evidence of left upper lobe pulmonary nodule. Nodule which is previously seen on chest x-ray favored to be due overlying skin lesion of the left upper chest. 2. Focal left lower lobe bronchiectasis with associated bronchial wall thickening and mucoid impaction, findings are likely due to chronic infection or aspiration. 3. Solid left lower lobe pulmonary nodule measuring 4 mm. No follow-up needed if patient is low-risk. Non-contrast chest CT can be considered in 12 months if patient is high-risk. This  He improved with nebulizers and steroids and he was discharged home with a plan to be on oral prednisone and a Z-Pak.  However the patient states that he never got the prednisone or the Z-Pak.  He is been using albuterol 2-3 times a day which helps but he continues to have shortness of breath and wheezing and cough and congestion with purulent sputum.  Today on exam he has prominent rhonchorous breath sounds in the left lower lobe as well as diminished breath sounds all throughout and wheezing on expiration.  At that time, my plan was:  Patient is having a COPD exacerbation complicated by bronchiectasis and what I feel could be a developing pneumonia in his left lower lobe.  Due to miscommunication he has not taken the antibiotics and steroids as prescribed in the emergency room.  Therefore I will start the patient on Levaquin 500 mg daily for 7 days.  Start the prednisone taper pack in addition albuterol 2 puffs inhaled every 6 hours as needed.  Follow-up later this week if no better or next week for  recheck to ensure improvement in the breath sounds in the left posterior lower lobe.  01/01/21 Breathing has improved.  He is back to his baseline.  He is still wheezing on exam today however it is markedly improved compared to his last visit.  He denies any hemoptysis or purulent sputum.  He denies any fevers or chills.  He denies any chest pain.  He is reduced his albuterol and now is using it sparingly.  He is still using his Anoro.  He has been consistent with his Anoro for the last year.   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 Dr. Orpah Greek   Shortness of breath dyspnea    Smoker    Past Surgical History:  Procedure Laterality Date   CARDIAC CATHETERIZATION     heart stents     HEMATOMA EVACUATION  08/22/2015   Procedure: EVACUATION HEMATOMA;  Surgeon: Jerrell Belfast, MD;  Location: [facility];  Service: ENT;;   PAROTIDECTOMY Right 08/22/2015   Procedure: RIGHT PAROTID MASS;  Surgeon: Jerrell Belfast, MD;  Location[facility]  OR;  Service: ENT;  Laterality: Right;   TONSILLECTOMY     Current Outpatient Medications on File Prior to Visit  Medication Sig Dispense Refill   albuterol (VENTOLIN HFA) 108 (90 Base) MCG/ACT inhaler Inhale 2 puffs into the lungs every 4 (four) hours as needed for wheezing or shortness of breath. 8 g 0   ANORO ELLIPTA 62.5-25 MCG/ACT AEPB INHALE 1 PUFF INTO THE LUNGS DAILY. 60 each 0   carvedilol (COREG) 6.25 MG tablet Take 6.25 mg by mouth 2 (two) times daily with a meal.     lisinopril (ZESTRIL) 20 MG tablet Take 20 mg by mouth daily.     prasugrel (EFFIENT) 10 MG TABS tablet Take 10 mg by mouth daily.       rosuvastatin (CRESTOR) 20 MG tablet Take 20 mg by mouth at bedtime.      predniSONE (DELTASONE) 20 MG tablet 3 tabs poqday 1-2, 2 tabs poqday 3-4, 1 tab poqday 5-6 (Patient not taking: Reported on 01/01/2021) 12 tablet 0   No current facility-administered medications on file prior to visit.   Allergies  Allergen Reactions   No Known Allergies    Social History   Socioeconomic History   Marital status: Legally Separated    Spouse name: Not on file   Number of children: Not on file   Years of education: Not on file   Highest education level: Not on file  Occupational History   Not on file  Tobacco Use   Smoking status: Every Day    Packs/day: 0.50    Types: Cigarettes   Smokeless tobacco: Never  Vaping Use   Vaping Use: Never used  Substance and Sexual Activity   Alcohol use: No   Drug use: No   Sexual activity: Not on file  Other Topics Concern   Not on file  Social History Narrative   Not on file   Social Determinants of Health   Financial Resource Strain: Not on file  Food Insecurity: Not on file  Transportation Needs: Not on file  Physical Activity: Not on file  Stress: Not on file  Social Connections: Not on file  Intimate Partner Violence: Not on file   Family History  Problem Relation Age of Onset   Mental illness Brother    Heart disease Mother    COPD Father    Heart disease Sister    Heart disease Sister      Review of Systems  All other systems reviewed and are negative.     Objective:   Physical Exam Vitals reviewed.  Constitutional:      General: He is not in acute distress.    Appearance: He is underweight. He is not ill-appearing, toxic-appearing or diaphoretic.  HENT:     Head: Normocephalic and atraumatic.     Right Ear: External ear normal.     Left Ear: External ear normal.     Nose: Nose normal.     Mouth/Throat:     Pharynx: No oropharyngeal exudate.  Eyes:     General: No scleral icterus.       Right eye: No discharge.         Left eye: No discharge.     Conjunctiva/sclera: Conjunctivae normal.     Pupils: Pupils are equal, round, and reactive to light.  Neck:     Thyroid: No thyromegaly.     Vascular: No JVD.     Trachea: No tracheal deviation.  Cardiovascular:     Rate and Rhythm:  Normal rate and regular rhythm.     Heart sounds: No murmur heard.   No friction rub. No gallop.  Pulmonary:     Effort: Pulmonary effort is normal. No respiratory distress.     Breath sounds: Decreased air movement present. No stridor. Decreased breath sounds and wheezing present.  Chest:     Chest wall: No tenderness.  Abdominal:     General: Bowel sounds are normal. There is no distension.     Palpations: Abdomen is soft. There is no mass.     Tenderness: There is no abdominal tenderness. There is no guarding or rebound.  Musculoskeletal:        General: No tenderness. Normal range of motion.     Cervical back: Normal range of motion and neck supple.  Lymphadenopathy:     Cervical: No cervical adenopathy.  Skin:    General: Skin is warm.     Coloration: Skin is not pale.     Findings: No erythema or rash.  Neurological:     Mental Status: He is alert and oriented to person, place, and time.     Cranial Nerves: No cranial nerve deficit.     Motor: No abnormal muscle tone.     Coordination: Coordination normal.     Deep Tendon Reflexes: Reflexes are normal and symmetric.  Psychiatric:        Behavior: Behavior normal.        Thought Content: Thought content normal.        Judgment: Judgment normal.          Assessment & Plan:  COPD exacerbation ([place])  Smoker I explained to the patient that he has bronchiectasis in his left lower lobe this is a perfect set up for future pneumonias and recurrent COPD exacerbations.  Therefore I want to maximize his therapy.  Discontinue Anoro and replace with registry 2 inhalations twice daily indefinitely.  Use albuterol as needed.  Strongly encourage smoking cessation.   Also explained the presence of a 4 mm pulmonary nodule and recommended repeating a CT scan in 1 year to monitor this.

## 2021-09-19 ENCOUNTER — Other Ambulatory Visit: Payer: Medicare Other

## 2021-09-19 DIAGNOSIS — J441 Chronic obstructive pulmonary disease with (acute) exacerbation: Secondary | ICD-10-CM

## 2021-09-19 DIAGNOSIS — E7800 Pure hypercholesterolemia, unspecified: Secondary | ICD-10-CM

## 2021-09-19 DIAGNOSIS — I10 Essential (primary) hypertension: Secondary | ICD-10-CM

## 2021-09-19 DIAGNOSIS — Z125 Encounter for screening for malignant neoplasm of prostate: Secondary | ICD-10-CM

## 2021-09-20 LAB — CBC WITH DIFFERENTIAL/PLATELET
Absolute Monocytes: 666 cells/uL (ref 200–950)
Basophils Absolute: 31 cells/uL (ref 0–200)
Basophils Relative: 0.6 %
Eosinophils Absolute: 213 cells/uL (ref 15–500)
Eosinophils Relative: 4.1 %
HCT: 36.8 % — ABNORMAL LOW (ref 38.5–50.0)
Hemoglobin: 12.3 g/dL — ABNORMAL LOW (ref 13.2–17.1)
Lymphs Abs: 1451 cells/uL (ref 850–3900)
MCH: 29.9 pg (ref 27.0–33.0)
MCHC: 33.4 g/dL (ref 32.0–36.0)
MCV: 89.3 fL (ref 80.0–100.0)
MPV: 9.5 fL (ref 7.5–12.5)
Monocytes Relative: 12.8 %
Neutro Abs: 2839 cells/uL (ref 1500–7800)
Neutrophils Relative %: 54.6 %
Platelets: 215 10*3/uL (ref 140–400)
RBC: 4.12 10*6/uL — ABNORMAL LOW (ref 4.20–5.80)
RDW: 13.7 % (ref 11.0–15.0)
Total Lymphocyte: 27.9 %
WBC: 5.2 10*3/uL (ref 3.8–10.8)

## 2021-09-20 LAB — LIPID PANEL
Cholesterol: 142 mg/dL (ref <200)
HDL: 56 mg/dL (ref >=40)
LDL Cholesterol (Calc): 72 mg/dL (calc)
Non-HDL Cholesterol (Calc): 86 mg/dL (calc) (ref <130)
Total CHOL/HDL Ratio: 2.5 (calc) (ref <5.0)
Triglycerides: 55 mg/dL (ref <150)

## 2021-09-20 LAB — COMPREHENSIVE METABOLIC PANEL
AG Ratio: 1.7 (calc) (ref 1.0–2.5)
ALT: 16 U/L (ref 9–46)
AST: 22 U/L (ref 10–35)
Albumin: 4.1 g/dL (ref 3.6–5.1)
Alkaline phosphatase (APISO): 60 U/L (ref 35–144)
BUN/Creatinine Ratio: 12 (calc) (ref 6–22)
BUN: 16 mg/dL (ref 7–25)
CO2: 26 mmol/L (ref 20–32)
Calcium: 9.3 mg/dL (ref 8.6–10.3)
Chloride: 106 mmol/L (ref 98–110)
Creat: 1.31 mg/dL — ABNORMAL HIGH (ref 0.70–1.22)
Globulin: 2.4 g/dL (calc) (ref 1.9–3.7)
Glucose, Bld: 99 mg/dL (ref 65–99)
Potassium: 4.2 mmol/L (ref 3.5–5.3)
Sodium: 141 mmol/L (ref 135–146)
Total Bilirubin: 0.3 mg/dL (ref 0.2–1.2)
Total Protein: 6.5 g/dL (ref 6.1–8.1)

## 2021-09-20 LAB — PSA: PSA: 2.79 ng/mL (ref <=4.00)

## 2021-09-26 ENCOUNTER — Ambulatory Visit (INDEPENDENT_AMBULATORY_CARE_PROVIDER_SITE_OTHER): Payer: Medicare Other

## 2021-09-26 VITALS — BP 124/64 | HR 64 | Temp 98.2°F | Ht 70.0 in | Wt 139.4 lb

## 2021-09-26 DIAGNOSIS — Z0000 Encounter for general adult medical examination without abnormal findings: Secondary | ICD-10-CM | POA: Diagnosis not present

## 2021-09-26 NOTE — Patient Instructions (Signed)
Mr. Chad Clements , Thank you for taking time to come for your Medicare Wellness Visit. I appreciate your ongoing commitment to your health goals. Please review the following plan we discussed and let me know if I can assist you in the future.   These are the goals we discussed:  Goals      Exercise 3x per week (30 min per time)     Pt states he would like to walk.         This is a list of the screening recommended for you and due dates:  Health Maintenance  Topic Date Due   Flu Shot  01/05/2022*   COVID-19 Vaccine (4 - Moderna series) 01/05/2022*   Tetanus Vaccine  10/01/2022*   Zoster (Shingles) Vaccine (1 of 2) 10/01/2022*   Pneumonia Vaccine  Completed   HPV Vaccine  Aged Out  *Topic was postponed. The date shown is not the original due date.    Advanced directives: Advance directive discussed with you today. I have provided a copy for you to complete at home and have notarized. Once this is complete please bring a copy in to our office so we can scan it into your chart.   Conditions/risks identified: Aim for 30 minutes of exercise or brisk walking, 6-8 glasses of water, and 5 servings of fruits and vegetables each day.   Next appointment: Follow up in one year for your annual wellness visit. 09/2022  Preventive Care 65 Years and Older, Male  Preventive care refers to lifestyle choices and visits with your health care provider that can promote health and wellness. What does preventive care include? A yearly physical exam. This is also called an annual well check. Dental exams once or twice a year. Routine eye exams. Ask your health care provider how often you should have your eyes checked. Personal lifestyle choices, including: Daily care of your teeth and gums. Regular physical activity. Eating a healthy diet. Avoiding tobacco and drug use. Limiting alcohol use. Practicing safe sex. Taking low doses of aspirin every day. Taking vitamin and mineral supplements as  recommended by your health care provider. What happens during an annual well check? The services and screenings done by your health care provider during your annual well check will depend on your age, overall health, lifestyle risk factors, and family history of disease. Counseling  Your health care provider may ask you questions about your: Alcohol use. Tobacco use. Drug use. Emotional well-being. Home and relationship well-being. Sexual activity. Eating habits. History of falls. Memory and ability to understand (cognition). Work and work Statistician. Screening  You may have the following tests or measurements: Height, weight, and BMI. Blood pressure. Lipid and cholesterol levels. These may be checked every 5 years, or more frequently if you are over 34 years old. Skin check. Lung cancer screening. You may have this screening every year starting at age 59 if you have a 30-pack-year history of smoking and currently smoke or have quit within the past 15 years. Fecal occult blood test (FOBT) of the stool. You may have this test every year starting at age 68. Flexible sigmoidoscopy or colonoscopy. You may have a sigmoidoscopy every 5 years or a colonoscopy every 10 years starting at age 32. Prostate cancer screening. Recommendations will vary depending on your family history and other risks. Hepatitis C blood test. Hepatitis B blood test. Sexually transmitted disease (STD) testing. Diabetes screening. This is done by checking your blood sugar (glucose) after you have not eaten for a  while (fasting). You may have this done every 1-3 years. Abdominal aortic aneurysm (AAA) screening. You may need this if you are a current or former smoker. Osteoporosis. You may be screened starting at age 53 if you are at high risk. Talk with your health care provider about your test results, treatment options, and if necessary, the need for more tests. Vaccines  Your health care provider may recommend  certain vaccines, such as: Influenza vaccine. This is recommended every year. Tetanus, diphtheria, and acellular pertussis (Tdap, Td) vaccine. You may need a Td booster every 10 years. Zoster vaccine. You may need this after age 22. Pneumococcal 13-valent conjugate (PCV13) vaccine. One dose is recommended after age 70. Pneumococcal polysaccharide (PPSV23) vaccine. One dose is recommended after age 70. Talk to your health care provider about which screenings and vaccines you need and how often you need them. This information is not intended to replace advice given to you by your health care provider. Make sure you discuss any questions you have with your health care provider. Document Released: 01/19/2015 Document Revised: 09/12/2015 Document Reviewed: 10/24/2014 Elsevier Interactive Patient Education  2017 [place] Prevention in the Home Falls can cause injuries. They can happen to people of all ages. There are many things you can do to make your home safe and to help prevent falls. What can I do on the outside of my home? Regularly fix the edges of walkways and driveways and fix any cracks. Remove anything that might make you trip as you walk through a door, such as a raised step or threshold. Trim any bushes or trees on the path to your home. Use bright outdoor lighting. Clear any walking paths of anything that might make someone trip, such as rocks or tools. Regularly check to see if handrails are loose or broken. Make sure that both sides of any steps have handrails. Any raised decks and porches should have guardrails on the edges. Have any leaves, snow, or ice cleared regularly. Use sand or salt on walking paths during winter. Clean up any spills in your garage right away. This includes oil or grease spills. What can I do in the bathroom? Use night lights. Install grab bars by the toilet and in the tub and shower. Do not use towel bars as grab bars. Use non-skid mats or  decals in the tub or shower. If you need to sit down in the shower, use a plastic, non-slip stool. Keep the floor dry. Clean up any water that spills on the floor as soon as it happens. Remove soap buildup in the tub or shower regularly. Attach bath mats securely with double-sided non-slip rug tape. Do not have throw rugs and other things on the floor that can make you trip. What can I do in the bedroom? Use night lights. Make sure that you have a light by your bed that is easy to reach. Do not use any sheets or blankets that are too big for your bed. They should not hang down onto the floor. Have a firm chair that has side arms. You can use this for support while you get dressed. Do not have throw rugs and other things on the floor that can make you trip. What can I do in the kitchen? Clean up any spills right away. Avoid walking on wet floors. Keep items that you use a lot in easy-to-reach places. If you need to reach something above you, use a strong step stool that has a grab  bar. Keep electrical cords out of the way. Do not use floor polish or wax that makes floors slippery. If you must use wax, use non-skid floor wax. Do not have throw rugs and other things on the floor that can make you trip. What can I do with my stairs? Do not leave any items on the stairs. Make sure that there are handrails on both sides of the stairs and use them. Fix handrails that are broken or loose. Make sure that handrails are as long as the stairways. Check any carpeting to make sure that it is firmly attached to the stairs. Fix any carpet that is loose or worn. Avoid having throw rugs at the top or bottom of the stairs. If you do have throw rugs, attach them to the floor with carpet tape. Make sure that you have a light switch at the top of the stairs and the bottom of the stairs. If you do not have them, ask someone to add them for you. What else can I do to help prevent falls? Wear shoes that: Do not  have high heels. Have rubber bottoms. Are comfortable and fit you well. Are closed at the toe. Do not wear sandals. If you use a stepladder: Make sure that it is fully opened. Do not climb a closed stepladder. Make sure that both sides of the stepladder are locked into place. Ask someone to hold it for you, if possible. Clearly mark and make sure that you can see: Any grab bars or handrails. First and last steps. Where the edge of each step is. Use tools that help you move around (mobility aids) if they are needed. These include: Canes. Walkers. Scooters. Crutches. Turn on the lights when you go into a dark area. Replace any light bulbs as soon as they burn out. Set up your furniture so you have a clear path. Avoid moving your furniture around. If any of your floors are uneven, fix them. If there are any pets around you, be aware of where they are. Review your medicines with your doctor. Some medicines can make you feel dizzy. This can increase your chance of falling. Ask your doctor what other things that you can do to help prevent falls. This information is not intended to replace advice given to you by your health care provider. Make sure you discuss any questions you have with your health care provider. Document Released: 10/19/2008 Document Revised: 05/31/2015 Document Reviewed: 01/27/2014 Elsevier Interactive Patient Education  2017 [employer].

## 2021-09-26 NOTE — Progress Notes (Signed)
Subjective:   Chad Clements is a 81 y.o. male who presents for Medicare Annual/Subsequent preventive examination.  Review of Systems     Cardiac Risk Factors include: advanced age (>26men, >60 women);hypertension;dyslipidemia;male gender;sedentary lifestyle;smoking/ tobacco exposure     Objective:    Today's Vitals   09/26/21 0932  BP: 124/64  Pulse: 64  Temp: 98.2 F (36.8 C)  SpO2: 100%  Weight: 139 lb 6.4 oz (63.2 kg)  Height: 5\' 10"  (1.778 m)   Body mass index is 20 kg/m.     09/26/2021    9:43 AM 12/20/2020    8:12 AM 08/31/2020    3:07 PM 08/29/2019   10:57 AM 08/22/2015   10:00 PM 08/14/2015    8:37 AM  Advanced Directives  Does Patient Have a Medical Advance Directive? No No No Yes No No  Would patient like information on creating a medical advance directive? No - Patient declined No - Patient declined No - Patient declined  Yes - Educational materials given Yes - Educational materials given    Current Medications (verified) Outpatient Encounter Medications as of 09/26/2021  Medication Sig   albuterol (VENTOLIN HFA) 108 (90 Base) MCG/ACT inhaler Inhale 2 puffs into the lungs every 4 (four) hours as needed for wheezing or shortness of breath.   Budeson-Glycopyrrol-Formoterol (BREZTRI AEROSPHERE) 160-9-4.8 MCG/ACT AERO Inhale 2 puffs into the lungs 2 (two) times daily.   carvedilol (COREG) 6.25 MG tablet Take 6.25 mg by mouth 2 (two) times daily with a meal.   lisinopril (ZESTRIL) 20 MG tablet Take 20 mg by mouth daily.   prasugrel (EFFIENT) 10 MG TABS tablet Take 10 mg by mouth daily.    predniSONE (DELTASONE) 20 MG tablet 3 tabs poqday 1-2, 2 tabs poqday 3-4, 1 tab poqday 5-6   rosuvastatin (CRESTOR) 20 MG tablet Take 20 mg by mouth at bedtime.    No facility-administered encounter medications on file as of 09/26/2021.    Allergies (verified) No known allergies   History: Past Medical History:  Diagnosis Date   Benign tumor of parotid gland     excision 2017 ([place]in's tumor)   Cancer ([place])    lung cancer dx in august   COPD (chronic obstructive pulmonary disease) ([place])    Coronary artery disease    Hyperlipidemia    Hyperlipidemia    Hypertension    MI (myocardial infarction) ([place]) 1992, 1998   has 2 stents ([place]) instent restenosis of 3rd OM 2010. Dr. Orpah Greek   Pulmonary nodule    Shortness of breath dyspnea    Smoker    Past Surgical History:  Procedure Laterality Date   CARDIAC CATHETERIZATION     heart stents     HEMATOMA EVACUATION  08/22/2015   Procedure: EVACUATION HEMATOMA;  Surgeon: Jerrell Belfast, MD;  Location: [facility];  Service: ENT;;   PAROTIDECTOMY Right 08/22/2015   Procedure: RIGHT PAROTID MASS;  Surgeon: Jerrell Belfast, MD;  Location: [facility];  Service: ENT;  Laterality: Right;   TONSILLECTOMY     Family History  Problem Relation Age of Onset   Mental illness Brother    Heart disease Mother    COPD Father    Heart disease Sister    Heart disease Sister    Social History   Socioeconomic History   Marital status: Widowed    Spouse name: Not on file   Number of children: 4   Years of education: Not on file   Highest education level: Not on  file  Occupational History   Not on file  Tobacco Use   Smoking status: Every Day    Packs/day: 0.50    Types: Cigarettes   Smokeless tobacco: Never  Vaping Use   Vaping Use: Never used  Substance and Sexual Activity   Alcohol use: No   Drug use: No   Sexual activity: Not Currently  Other Topics Concern   Not on file  Social History Narrative   3 sons, 1 daughter, 6 grandchildren and 1 great grand.    Social Determinants of Health   Financial Resource Strain: Low Risk  (09/26/2021)   Overall Financial Resource Strain (CARDIA)    Difficulty of Paying Living Expenses: Not hard at all  Food Insecurity: No Food Insecurity (09/26/2021)   Hunger Vital Sign    Worried About Running Out of Food in the Last Year: Never true    Ran Out of Food in  the Last Year: Never true  Transportation Needs: No Transportation Needs (09/26/2021)   PRAPARE - Hydrologist (Medical): No    Lack of Transportation (Non-Medical): No  Physical Activity: Insufficiently Active (09/26/2021)   Exercise Vital Sign    Days of Exercise per Week: 5 days    Minutes of Exercise per Session: 20 min  Stress: No Stress Concern Present (09/26/2021)   [place]    Feeling of Stress : Not at all  Social Connections: Moderately Integrated (09/26/2021)   Social Connection and Isolation Panel [NHANES]    Frequency of Communication with Friends and Family: More than three times a week    Frequency of Social Gatherings with Friends and Family: More than three times a week    Attends Religious Services: More than 4 times per year    Active Member of [employer] or Organizations: Yes    Attends Archivist Meetings: More than 4 times per year    Marital Status: Widowed    Tobacco Counseling Ready to quit: Not Answered Counseling given: Not Answered   Clinical Intake:  Pre-visit preparation completed: Yes  Pain : No/denies pain     BMI - recorded: 20 Nutritional Status: BMI of 19-24  Normal Nutritional Risks: None Diabetes: No  How often do you need to have someone help you when you read instructions, pamphlets, or other written materials from your doctor or pharmacy?: 1 - Never  Diabetic?no  Interpreter Needed?: No  Information entered by :: mj Julliana Whitmyer, lpn   Activities of Daily Living    09/26/2021   10:01 AM 09/26/2021    9:44 AM  In your present state of health, do you have any difficulty performing the following activities:  Hearing? 0 0  Vision? 1 1  Difficulty concentrating or making decisions? 0 0  Walking or climbing stairs? 0 0  Dressing or bathing? 0 0  Doing errands, shopping? 0 0  Preparing Food and eating ? N N  Using the Toilet? N N  In  the past six months, have you accidently leaked urine? N Y  Do you have problems with loss of bowel control? N N  Managing your Medications? N N  Managing your Finances? N N  Housekeeping or managing your Housekeeping? N N    Patient Care Team: Susy Frizzle, MD as PCP - General (Family Medicine)  Indicate any recent Medical Services you may have received from other than [facility] providers in the past year (date may be approximate).  Assessment:   This is a routine wellness examination for [place].  Hearing/Vision screen No results found.  Dietary issues and exercise activities discussed: Current Exercise Habits: Home exercise routine, Type of exercise: walking, Time (Minutes): 20, Frequency (Times/Week): 5, Weekly Exercise (Minutes/Week): 100, Intensity: Mild, Exercise limited by: cardiac condition(s)   Goals Addressed             This Visit's Progress    Exercise 3x per week (30 min per time)       Pt states he would like to walk.        Depression Screen    09/26/2021    9:39 AM 08/31/2020    3:03 PM 08/29/2019   10:58 AM 08/27/2018   10:23 AM 10/28/2016   10:31 AM  PHQ 2/9 Scores  PHQ - 2 Score 0 0 0 0 0    Fall Risk    09/26/2021    9:44 AM 08/31/2020    3:03 PM 08/29/2019   10:58 AM 08/27/2018   10:23 AM 10/28/2016   10:31 AM  Fall Risk   Falls in the past year? 0 0 0 0 No  Number falls in past yr: 0 0 0    Injury with Fall? 0 0 0    Risk for fall due to : No Fall Risks No Fall Risks     Follow up Falls prevention discussed Falls evaluation completed  Falls evaluation completed     [place]:  Any stairs in or around the home? Yes  If so, are there any without handrails? No  Home free of loose throw rugs in walkways, pet beds, electrical cords, etc? Yes  Adequate lighting in your home to reduce risk of falls? Yes   ASSISTIVE DEVICES UTILIZED TO PREVENT FALLS:  Life alert? No  Use of a cane, walker or w/c? No   Grab bars in the bathroom? No  Shower chair or bench in shower? No  Elevated toilet seat or a handicapped toilet? Yes   TIMED UP AND GO:  Was the test performed? Yes .  Length of time to ambulate 10 feet: 10 sec.   Gait steady and fast without use of assistive device  Cognitive Function:        09/26/2021   10:01 AM 09/26/2021    9:45 AM 08/29/2019   10:59 AM  6CIT Screen  What Year? 0 points 0 points 0 points  What month? 0 points 0 points 0 points  What time? 0 points 0 points 0 points  Count back from 20 0 points 0 points 0 points  Months in reverse 0 points 0 points 0 points  Repeat phrase 0 points 2 points 0 points  Total Score 0 points 2 points 0 points    Immunizations Immunization History  Administered Date(s) Administered   Fluad Quad(high Dose 65+) 12/15/2019   Influenza, High Dose Seasonal PF 10/28/2016   Influenza,inj,Quad PF,6+ Mos 01/17/2014   Moderna Sars-Covid-2 Vaccination 01/25/2019, 02/22/2019, 11/07/2019   Pneumococcal Conjugate-13 10/28/2016   Pneumococcal Polysaccharide-23 01/17/2014    TDAP status: Due, Education has been provided regarding the importance of this vaccine. Advised may receive this vaccine at local pharmacy or Health Dept. Aware to provide a copy of the vaccination record if obtained from local pharmacy or Health Dept. Verbalized acceptance and understanding.  Flu Vaccine status: Due, Education has been provided regarding the importance of this vaccine. Advised may receive this vaccine at local pharmacy or Health Dept.  Aware to provide a copy of the vaccination record if obtained from local pharmacy or Health Dept. Verbalized acceptance and understanding.  Pneumococcal vaccine status: Up to date  Covid-19 vaccine status: Completed vaccines  Qualifies for Shingles Vaccine? Yes   Zostavax completed No   Shingrix Completed?: No.    Education has been provided regarding the importance of this vaccine. Patient has been advised to call  insurance company to determine out of pocket expense if they have not yet received this vaccine. Advised may also receive vaccine at local pharmacy or Health Dept. Verbalized acceptance and understanding.  Screening Tests Health Maintenance  Topic Date Due   INFLUENZA VACCINE  01/05/2022 (Originally 08/06/2021)   COVID-19 Vaccine (4 - Moderna series) 01/05/2022 (Originally 01/02/2020)   TETANUS/TDAP  10/01/2022 (Originally 08/06/1959)   Zoster Vaccines- Shingrix (1 of 2) 10/01/2022 (Originally 08/07/1991)   Pneumonia Vaccine 71+ Years old  Completed   HPV VACCINES  Aged Out    Health Maintenance  There are no preventive care reminders to display for this patient.   Colorectal cancer screening: No longer required.   Lung Cancer Screening: (Low Dose CT Chest recommended if Age 43-80 years, 30 pack-year currently smoking OR have quit w/in 15years.) does not qualify.   Lung Cancer Screening Referral: CURRENTLY UNDER TREATMENT FOR LUNG CA  Additional Screening:  Hepatitis C Screening: does not qualify; Completed N/A  Vision Screening: Recommended annual ophthalmology exams for early detection of glaucoma and other disorders of the eye. Is the patient up to date with their annual eye exam?  No  Who is the provider or what is the name of the office in which the patient attends annual eye exams? N/A If pt is not established with a provider, would they like to be referred to a provider to establish care? No .   Dental Screening: Recommended annual dental exams for proper oral hygiene  Community Resource Referral / Chronic Care Management: CRR required this visit?  No   CCM required this visit?  No      Plan:     I have personally reviewed and noted the following in the patient's chart:   Medical and social history Use of alcohol, tobacco or illicit drugs  Current medications and supplements including opioid prescriptions. Patient is not currently taking opioid  prescriptions. Functional ability and status Nutritional status Physical activity Advanced directives List of other physicians Hospitalizations, surgeries, and ER visits in previous 12 months Vitals Screenings to include cognitive, depression, and falls Referrals and appointments  In addition, I have reviewed and discussed with patient certain preventive protocols, quality metrics, and best practice recommendations. A written personalized care plan for preventive services as well as general preventive health recommendations were provided to patient.     Chriss Driver, LPN   [id number]   Nurse Notes: Discussed Shingrix. Advised pt to check with his oncologist regarding. Pt verbalized understanding of all.

## 2021-11-09 ENCOUNTER — Emergency Department (HOSPITAL_COMMUNITY)
Admission: EM | Admit: 2021-11-09 | Discharge: 2021-11-10 | Disposition: A | Discharge status: Home / Self Care | Payer: Medicare Other | Attending: Emergency Medicine | Admitting: Emergency Medicine

## 2021-11-09 ENCOUNTER — Encounter (HOSPITAL_COMMUNITY): Payer: Self-pay

## 2021-11-09 ENCOUNTER — Emergency Department (HOSPITAL_COMMUNITY): Payer: Medicare Other

## 2021-11-09 ENCOUNTER — Other Ambulatory Visit: Payer: Self-pay

## 2021-11-09 DIAGNOSIS — R062 Wheezing: Secondary | ICD-10-CM | POA: Diagnosis not present

## 2021-11-09 DIAGNOSIS — Z85118 Personal history of other malignant neoplasm of bronchus and lung: Secondary | ICD-10-CM | POA: Diagnosis not present

## 2021-11-09 DIAGNOSIS — I10 Essential (primary) hypertension: Secondary | ICD-10-CM | POA: Insufficient documentation

## 2021-11-09 DIAGNOSIS — J449 Chronic obstructive pulmonary disease, unspecified: Secondary | ICD-10-CM | POA: Insufficient documentation

## 2021-11-09 DIAGNOSIS — R0602 Shortness of breath: Secondary | ICD-10-CM | POA: Insufficient documentation

## 2021-11-09 DIAGNOSIS — D649 Anemia, unspecified: Secondary | ICD-10-CM

## 2021-11-09 DIAGNOSIS — I2510 Atherosclerotic heart disease of native coronary artery without angina pectoris: Secondary | ICD-10-CM | POA: Insufficient documentation

## 2021-11-09 DIAGNOSIS — F17200 Nicotine dependence, unspecified, uncomplicated: Secondary | ICD-10-CM | POA: Diagnosis not present

## 2021-11-09 DIAGNOSIS — Z79899 Other long term (current) drug therapy: Secondary | ICD-10-CM | POA: Diagnosis not present

## 2021-11-09 DIAGNOSIS — J441 Chronic obstructive pulmonary disease with (acute) exacerbation: Secondary | ICD-10-CM

## 2021-11-09 LAB — CBC WITH DIFFERENTIAL/PLATELET
Abs Immature Granulocytes: 0.01 10*3/uL (ref 0.00–0.07)
Basophils Absolute: 0 10*3/uL (ref 0.0–0.1)
Basophils Relative: 1 %
Eosinophils Absolute: 0.4 10*3/uL (ref 0.0–0.5)
Eosinophils Relative: 8 %
HCT: 38.1 % — ABNORMAL LOW (ref 39.0–52.0)
Hemoglobin: 12.8 g/dL — ABNORMAL LOW (ref 13.0–17.0)
Immature Granulocytes: 0 %
Lymphocytes Relative: 27 %
Lymphs Abs: 1.2 10*3/uL (ref 0.7–4.0)
MCH: 30 pg (ref 26.0–34.0)
MCHC: 33.6 g/dL (ref 30.0–36.0)
MCV: 89.4 fL (ref 80.0–100.0)
Monocytes Absolute: 0.6 10*3/uL (ref 0.1–1.0)
Monocytes Relative: 13 %
Neutro Abs: 2.2 10*3/uL (ref 1.7–7.7)
Neutrophils Relative %: 51 %
Platelets: 179 10*3/uL (ref 150–400)
RBC: 4.26 MIL/uL (ref 4.22–5.81)
RDW: 14.2 % (ref 11.5–15.5)
WBC: 4.3 10*3/uL (ref 4.0–10.5)
nRBC: 0 % (ref 0.0–0.2)

## 2021-11-09 LAB — BASIC METABOLIC PANEL
Anion gap: 6 (ref 5–15)
BUN: 20 mg/dL (ref 8–23)
CO2: 29 mmol/L (ref 22–32)
Calcium: 9.2 mg/dL (ref 8.9–10.3)
Chloride: 102 mmol/L (ref 98–111)
Creatinine, Ser: 1.34 mg/dL — ABNORMAL HIGH (ref 0.61–1.24)
GFR, Estimated: 54 mL/min — ABNORMAL LOW (ref >60)
Glucose, Bld: 86 mg/dL (ref 70–99)
Potassium: 3.8 mmol/L (ref 3.5–5.1)
Sodium: 137 mmol/L (ref 135–145)

## 2021-11-09 LAB — TROPONIN I (HIGH SENSITIVITY): Troponin I (High Sensitivity): 15 ng/L (ref <18)

## 2021-11-09 MED ORDER — DEXAMETHASONE SODIUM PHOSPHATE 10 MG/ML IJ SOLN
10.0000 mg | Freq: Once | INTRAMUSCULAR | Status: AC
  Administered 2021-11-09: 10 mg via INTRAVENOUS
  Filled 2021-11-09: qty 1

## 2021-11-09 MED ORDER — IPRATROPIUM-ALBUTEROL 0.5-2.5 (3) MG/3ML IN SOLN
3.0000 mL | Freq: Once | RESPIRATORY_TRACT | Status: AC
  Administered 2021-11-09: 3 mL via RESPIRATORY_TRACT
  Filled 2021-11-09: qty 3

## 2021-11-09 MED ORDER — ALBUTEROL SULFATE (5 MG/ML) 0.5% IN NEBU
2.5000 mg | INHALATION_SOLUTION | Freq: Four times a day (QID) | RESPIRATORY_TRACT | 12 refills | Status: AC | PRN
Start: 2021-11-09 — End: ?

## 2021-11-09 NOTE — ED Provider Notes (Signed)
Care assumed from [facility], [place], patient with shortness of breath and wheezing pending response to nebulizer treatment and troponin x2.  Following nebulizer treatment, he is feeling much better.  Lungs are clear.  Troponin is normal x2.  He is felt to be safe for discharge.  I have sent a prescription for a 5-day course of prednisone.  He is to continue using his inhalers at home, return if symptoms or not being adequately controlled at home.   Delora Fuel, MD 14/48/18 [phone]

## 2021-11-09 NOTE — ED Triage Notes (Signed)
SOBx 3 days worsening. 1 lung

## 2021-11-09 NOTE — ED Provider Notes (Signed)
[facility] EMERGENCY DEPARTMENT Provider Note   CSN: [id number] Arrival date & time: 11/09/21  2144     History  Chief Complaint  Patient presents with   Shortness of Breath    Chad Clements is a 81 y.o. male.  Patient presents to the Emergency Department complaining of increased shortness of breath over the past 2 days.  Patient denies chest pain, abdominal pain, nausea, vomiting.  Patient endorses being a daily smoker.  Patient with history of partial colectomy, previous MI, hypertension, CAD, COPD, lung cancer diagnosed in August  HPI     Home Medications Prior to Admission medications   Medication Sig Start Date End Date Taking? Authorizing Provider  albuterol (PROVENTIL) (5 MG/ML) 0.5% nebulizer solution Take 0.5 mLs (2.5 mg total) by nebulization every 6 (six) hours as needed for wheezing or shortness of breath. 11/09/21  Yes Dorothyann Peng, PA-C  albuterol (VENTOLIN HFA) 108 (90 Base) MCG/ACT inhaler Inhale 2 puffs into the lungs every 4 (four) hours as needed for wheezing or shortness of breath. 01/01/21   Susy Frizzle, MD  Budeson-Glycopyrrol-Formoterol (BREZTRI AEROSPHERE) 160-9-4.8 MCG/ACT AERO Inhale 2 puffs into the lungs 2 (two) times daily. 01/01/21   Susy Frizzle, MD  carvedilol (COREG) 6.25 MG tablet Take 6.25 mg by mouth 2 (two) times daily with a meal.    [provider]  lisinopril (ZESTRIL) 20 MG tablet Take 20 mg by mouth daily. 04/13/20   [provider]  prasugrel (EFFIENT) 10 MG TABS tablet Take 10 mg by mouth daily.  08/16/18   [provider]  predniSONE (DELTASONE) 20 MG tablet 3 tabs poqday 1-2, 2 tabs poqday 3-4, 1 tab poqday 5-6 12/24/20   Susy Frizzle, MD  rosuvastatin (CRESTOR) 20 MG tablet Take 20 mg by mouth at bedtime.  08/16/18   [provider]      Allergies    No known allergies    Review of Systems   Review of Systems  Respiratory:  Positive for shortness of breath and wheezing.  Negative for cough.   Cardiovascular:  Negative for chest pain, palpitations and leg swelling.  Gastrointestinal:  Negative for abdominal pain, nausea and vomiting.  Genitourinary:  Negative for dysuria.    Physical Exam Updated Vital Signs BP 118/85   Pulse 70   Temp 98.1 F (36.7 C) (Oral)   Resp 19   Ht 5' 10.5" (1.791 m)   Wt 61.2 kg   SpO2 97%   BMI 19.10 kg/m  Physical Exam Vitals and nursing note reviewed.  Constitutional:      General: He is not in acute distress.    Appearance: He is well-developed.  HENT:     Head: Normocephalic and atraumatic.  Eyes:     Conjunctiva/sclera: Conjunctivae normal.  Cardiovascular:     Rate and Rhythm: Normal rate and regular rhythm.     Heart sounds: No murmur heard. Pulmonary:     Effort: Pulmonary effort is normal. No respiratory distress.     Breath sounds: Examination of the right-lower field reveals wheezing. Wheezing present.  Chest:     Chest wall: No tenderness.  Abdominal:     Palpations: Abdomen is soft.     Tenderness: There is no abdominal tenderness.  Musculoskeletal:        General: No swelling.     Cervical back: Neck supple.  Skin:    General: Skin is warm and dry.     Capillary Refill: Capillary refill takes  less than 2 seconds.  Neurological:     Mental Status: He is alert.  Psychiatric:        Mood and Affect: Mood normal.     ED Results / Procedures / Treatments   Labs (all labs ordered are listed, but only abnormal results are displayed) Labs Reviewed  BASIC METABOLIC PANEL - Abnormal; Notable for the following components:      Result Value   Creatinine, Ser 1.34 (*)    GFR, Estimated 54 (*)    All other components within normal limits  CBC WITH DIFFERENTIAL/PLATELET - Abnormal; Notable for the following components:   Hemoglobin 12.8 (*)    HCT 38.1 (*)    All other components within normal limits  TROPONIN I (HIGH SENSITIVITY)    EKG None  Radiology DG Chest 2 View  Result Date:  11/09/2021 CLINICAL DATA:  Worsening shortness of breath. EXAM: CHEST - 2 VIEW COMPARISON:  December 20, 2020 FINDINGS: Numerous overlying radiopaque cardiac lead wires are noted. The heart size and mediastinal contours are within normal limits. A coronary artery stent is in place. There is mild tortuosity of the descending thoracic aorta. The lungs are mildly hyperinflated. There is no evidence of acute infiltrate, pleural effusion or pneumothorax. The left upper lobe pulmonary nodule seen on the prior study is not clearly identified on the current exam. The visualized skeletal structures are unremarkable. IMPRESSION: Mild hyperinflation without evidence of acute or active cardiopulmonary disease. Electronically Signed   By: Virgina Norfolk M.D.   On: 11/09/2021 22:55    Procedures Procedures    Medications Ordered in ED Medications  dexamethasone (DECADRON) injection 10 mg (has no administration in time range)  ipratropium-albuterol (DUONEB) 0.5-2.5 (3) MG/3ML nebulizer solution 3 mL (3 mLs Nebulization Given 11/09/21 2300)    ED Course/ Medical Decision Making/ A&P                           Medical Decision Making Amount and/or Complexity of Data Reviewed Labs: ordered. Radiology: ordered.  Risk Prescription drug management.   This patient presents to the ED for concern of shortness of breath, this involves an extensive number of treatment options, and is a complaint that carries with it a high risk of complications and morbidity.  The differential diagnosis includes pneumonia, ACS, COPD, PE, and others   Co morbidities that complicate the patient evaluation  History of COPD, lung cancer   Additional history obtained:  Additional history obtained from family at bedside External records from outside source obtained and reviewed including notes from this year from the [facility]   Lab Tests:  I Ordered, and personally interpreted labs.  The pertinent results include: WBC 4.3,  baseline creatinine, pending troponins   Imaging Studies ordered:  I ordered imaging studies including chest x-ray I independently visualized and interpreted imaging which showed  Mild hyperinflation without evidence of acute or active  cardiopulmonary disease.   I agree with the radiologist interpretation   Cardiac Monitoring: / EKG:  The patient was maintained on a cardiac monitor.  I personally viewed and interpreted the cardiac monitored which showed an underlying rhythm of: Sinus rhythm  Problem List / ED Course / Critical interventions / Medication management   I ordered medication including Decadron and DuoNeb for wheezing Reevaluation of the patient after these medicines showed that the patient improved I have reviewed the patients home medicines and have made adjustments as needed   Test /  Admission - Considered:  Patient care being transferred to Dr. Roxanne Mins at shift handoff.  Patient with shortness of breath.  No signs of pneumonia on chest x-ray.  COPD exacerbation high on differential at this time.  Patient improved with first DuoNeb.  Plan to trend troponins.  If troponins are flat can likely discharge home and have patient [facility]        Final Clinical Impression(s) / ED Diagnoses Final diagnoses:  SOB (shortness of breath)    Rx / DC Orders ED Discharge Orders          Ordered    albuterol (PROVENTIL) (5 MG/ML) 0.5% nebulizer solution  Every 6 hours PRN        11/09/21 2328              Dorothyann Peng, PA-C 11/09/21 2328    Milton Ferguson, MD 11/11/21 1221

## 2021-11-10 LAB — TROPONIN I (HIGH SENSITIVITY): Troponin I (High Sensitivity): 17 ng/L (ref <18)

## 2021-11-10 MED ORDER — PREDNISONE 50 MG PO TABS: 50.0000 mg | ORAL_TABLET | Freq: Every day | ORAL | 0 refills | Status: DC

## 2021-11-10 NOTE — Discharge Instructions (Signed)
Continue using your inhalers as needed.  Return to the emergency department if symptoms or not being adequately controlled at home.

## 2021-11-27 ENCOUNTER — Other Ambulatory Visit: Payer: Self-pay | Admitting: Family Medicine

## 2021-11-27 ENCOUNTER — Telehealth: Payer: Self-pay

## 2021-11-27 MED ORDER — ALBUTEROL SULFATE HFA 108 (90 BASE) MCG/ACT IN AERS: 2.0000 | INHALATION_SPRAY | RESPIRATORY_TRACT | 3 refills | Status: AC | PRN

## 2021-11-27 NOTE — Telephone Encounter (Signed)
Pt's daughter called in to request pt get a refill of albuterol (VENTOLIN HFA) 108 (90 Base) MCG/ACT inhaler [[id number]]. Pt was last seen in office by NP and forgot to tell provider that he needed a refill of this med. Please advise.  Cb#: [phone]  Pharmacy: [place], [place] - [address]  [address] [place] [address]

## 2021-12-23 ENCOUNTER — Emergency Department (HOSPITAL_COMMUNITY): Payer: Medicare Other

## 2021-12-23 ENCOUNTER — Observation Stay (HOSPITAL_COMMUNITY): Payer: Medicare Other

## 2021-12-23 ENCOUNTER — Encounter (HOSPITAL_COMMUNITY): Payer: Self-pay

## 2021-12-23 ENCOUNTER — Inpatient Hospital Stay (HOSPITAL_COMMUNITY)
Admission: EM | Admit: 2021-12-23 | Discharge: 2022-01-06 | DRG: 388 | Disposition: E | Payer: Medicare Other | Attending: Internal Medicine | Admitting: Internal Medicine

## 2021-12-23 DIAGNOSIS — N289 Disorder of kidney and ureter, unspecified: Secondary | ICD-10-CM | POA: Diagnosis not present

## 2021-12-23 DIAGNOSIS — Z818 Family history of other mental and behavioral disorders: Secondary | ICD-10-CM

## 2021-12-23 DIAGNOSIS — R911 Solitary pulmonary nodule: Secondary | ICD-10-CM | POA: Diagnosis present

## 2021-12-23 DIAGNOSIS — Z515 Encounter for palliative care: Secondary | ICD-10-CM

## 2021-12-23 DIAGNOSIS — F17210 Nicotine dependence, cigarettes, uncomplicated: Secondary | ICD-10-CM | POA: Diagnosis present

## 2021-12-23 DIAGNOSIS — N179 Acute kidney failure, unspecified: Secondary | ICD-10-CM | POA: Diagnosis present

## 2021-12-23 DIAGNOSIS — I10 Essential (primary) hypertension: Secondary | ICD-10-CM | POA: Diagnosis present

## 2021-12-23 DIAGNOSIS — Z825 Family history of asthma and other chronic lower respiratory diseases: Secondary | ICD-10-CM

## 2021-12-23 DIAGNOSIS — Z7951 Long term (current) use of inhaled steroids: Secondary | ICD-10-CM

## 2021-12-23 DIAGNOSIS — R109 Unspecified abdominal pain: Secondary | ICD-10-CM | POA: Insufficient documentation

## 2021-12-23 DIAGNOSIS — K56609 Unspecified intestinal obstruction, unspecified as to partial versus complete obstruction: Principal | ICD-10-CM | POA: Diagnosis present

## 2021-12-23 DIAGNOSIS — Z79899 Other long term (current) drug therapy: Secondary | ICD-10-CM

## 2021-12-23 DIAGNOSIS — E440 Moderate protein-calorie malnutrition: Secondary | ICD-10-CM | POA: Diagnosis present

## 2021-12-23 DIAGNOSIS — I252 Old myocardial infarction: Secondary | ICD-10-CM

## 2021-12-23 DIAGNOSIS — R112 Nausea with vomiting, unspecified: Secondary | ICD-10-CM | POA: Diagnosis present

## 2021-12-23 DIAGNOSIS — Z7952 Long term (current) use of systemic steroids: Secondary | ICD-10-CM

## 2021-12-23 DIAGNOSIS — I129 Hypertensive chronic kidney disease with stage 1 through stage 4 chronic kidney disease, or unspecified chronic kidney disease: Secondary | ICD-10-CM | POA: Diagnosis present

## 2021-12-23 DIAGNOSIS — E785 Hyperlipidemia, unspecified: Secondary | ICD-10-CM | POA: Diagnosis present

## 2021-12-23 DIAGNOSIS — R1084 Generalized abdominal pain: Secondary | ICD-10-CM

## 2021-12-23 DIAGNOSIS — F03911 Unspecified dementia, unspecified severity, with agitation: Secondary | ICD-10-CM | POA: Diagnosis present

## 2021-12-23 DIAGNOSIS — Z955 Presence of coronary angioplasty implant and graft: Secondary | ICD-10-CM

## 2021-12-23 DIAGNOSIS — Z681 Body mass index (BMI) 19 or less, adult: Secondary | ICD-10-CM

## 2021-12-23 DIAGNOSIS — R1114 Bilious vomiting: Secondary | ICD-10-CM

## 2021-12-23 DIAGNOSIS — J9601 Acute respiratory failure with hypoxia: Secondary | ICD-10-CM | POA: Diagnosis not present

## 2021-12-23 DIAGNOSIS — N1831 Chronic kidney disease, stage 3a: Secondary | ICD-10-CM | POA: Diagnosis present

## 2021-12-23 DIAGNOSIS — Z8249 Family history of ischemic heart disease and other diseases of the circulatory system: Secondary | ICD-10-CM

## 2021-12-23 DIAGNOSIS — C3490 Malignant neoplasm of unspecified part of unspecified bronchus or lung: Secondary | ICD-10-CM | POA: Diagnosis present

## 2021-12-23 DIAGNOSIS — I2510 Atherosclerotic heart disease of native coronary artery without angina pectoris: Secondary | ICD-10-CM | POA: Diagnosis present

## 2021-12-23 DIAGNOSIS — E872 Acidosis, unspecified: Secondary | ICD-10-CM | POA: Diagnosis present

## 2021-12-23 DIAGNOSIS — Z66 Do not resuscitate: Secondary | ICD-10-CM | POA: Diagnosis not present

## 2021-12-23 LAB — COMPREHENSIVE METABOLIC PANEL
ALT: 26 U/L (ref 0–44)
AST: 28 U/L (ref 15–41)
Albumin: 4.4 g/dL (ref 3.5–5.0)
Alkaline Phosphatase: 65 U/L (ref 38–126)
Anion gap: 14 (ref 5–15)
BUN: 33 mg/dL — ABNORMAL HIGH (ref 8–23)
CO2: 26 mmol/L (ref 22–32)
Calcium: 10.3 mg/dL (ref 8.9–10.3)
Chloride: 101 mmol/L (ref 98–111)
Creatinine, Ser: 1.62 mg/dL — ABNORMAL HIGH (ref 0.61–1.24)
GFR, Estimated: 43 mL/min — ABNORMAL LOW (ref >60)
Glucose, Bld: 137 mg/dL — ABNORMAL HIGH (ref 70–99)
Potassium: 4.6 mmol/L (ref 3.5–5.1)
Sodium: 141 mmol/L (ref 135–145)
Total Bilirubin: 0.6 mg/dL (ref 0.3–1.2)
Total Protein: 7.8 g/dL (ref 6.5–8.1)

## 2021-12-23 LAB — URINALYSIS, ROUTINE W REFLEX MICROSCOPIC
Bacteria, UA: NONE SEEN
Bilirubin Urine: NEGATIVE
Glucose, UA: 50 mg/dL — AB
Hgb urine dipstick: NEGATIVE
Ketones, ur: 5 mg/dL — AB
Leukocytes,Ua: NEGATIVE
Nitrite: NEGATIVE
Protein, ur: 100 mg/dL — AB
Specific Gravity, Urine: 1.021 (ref 1.005–1.030)
pH: 5 (ref 5.0–8.0)

## 2021-12-23 LAB — CBC
HCT: 44.5 % (ref 39.0–52.0)
Hemoglobin: 14.4 g/dL (ref 13.0–17.0)
MCH: 29.3 pg (ref 26.0–34.0)
MCHC: 32.4 g/dL (ref 30.0–36.0)
MCV: 90.4 fL (ref 80.0–100.0)
Platelets: 232 10*3/uL (ref 150–400)
RBC: 4.92 MIL/uL (ref 4.22–5.81)
RDW: 13.9 % (ref 11.5–15.5)
WBC: 9.4 10*3/uL (ref 4.0–10.5)
nRBC: 0 % (ref 0.0–0.2)

## 2021-12-23 LAB — LIPASE, BLOOD: Lipase: 31 U/L (ref 11–51)

## 2021-12-23 MED ORDER — MORPHINE SULFATE (PF) 4 MG/ML IV SOLN
4.0000 mg | Freq: Once | INTRAVENOUS | Status: AC
  Administered 2021-12-23: 4 mg via INTRAVENOUS
  Filled 2021-12-23: qty 1

## 2021-12-23 MED ORDER — SODIUM CHLORIDE 0.9 % IV BOLUS
500.0000 mL | Freq: Once | INTRAVENOUS | Status: AC
  Administered 2021-12-23: 500 mL via INTRAVENOUS

## 2021-12-23 MED ORDER — IOHEXOL 300 MG/ML  SOLN
75.0000 mL | Freq: Once | INTRAMUSCULAR | Status: AC | PRN
  Administered 2021-12-23: 75 mL via INTRAVENOUS

## 2021-12-23 MED ORDER — ONDANSETRON HCL 4 MG/2ML IJ SOLN
4.0000 mg | Freq: Once | INTRAMUSCULAR | Status: AC
  Administered 2021-12-23: 4 mg via INTRAVENOUS
  Filled 2021-12-23: qty 2

## 2021-12-23 MED ORDER — SODIUM CHLORIDE 0.9 % IV SOLN: Freq: Once | INTRAVENOUS | Status: AC

## 2021-12-23 NOTE — ED Notes (Addendum)
NG tube repositioned, will order repeat xray to confirm placement. Dr. Maudie Mercury @ bedside

## 2021-12-23 NOTE — ED Notes (Signed)
Patient transported to CT 

## 2021-12-23 NOTE — H&P (Signed)
History and Physical    Patient: Chad Clements OZH:[id number] DOB: April 08, 1940 DOA: 12/23/2021 DOS: the patient was seen and examined on 12/23/2021 PCP: Donita Brooks, MD  Patient coming from: home  Chief Complaint:  Chief Complaint  Patient presents with   Abdominal Pain   HPI: Chad Clements is a 81 y.o. 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   Pt denies fever, chills,  diarrhea, constipation, brbpr, black stool, dysuria, hematuria.  ED consulted general surgery Dr. Lovell Sheehan who per ED recommended NGT to low intermittent suction as well as NPO Surgery will be by to see the patient in the morning per Dr. 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 Dr. Daryel November   Pulmonary nodule    Shortness of breath dyspnea    Smoker    Past Surgical History:  Procedure Laterality Date   CARDIAC CATHETERIZATION     heart stents     HEMATOMA EVACUATION  08/22/2015   Procedure: EVACUATION HEMATOMA;  Surgeon: Osborn Coho, MD;  Location: [facility] OR;  Service: ENT;;   PAROTIDECTOMY Right 08/22/2015   Procedure: RIGHT PAROTID MASS;  Surgeon: Osborn Coho, MD;  Location: [facility] OR;  Service: ENT;  Laterality: Right;   TONSILLECTOMY     Social History:  reports that he has been smoking cigarettes. He has been smoking an average of .5 packs per day. He has never used smokeless  tobacco. He reports that he does not drink alcohol and does not use drugs.  Allergies  Allergen Reactions   No Known Allergies     Family History  Problem Relation Age of Onset   Mental illness Brother    Heart disease Mother    COPD Father    Heart disease Sister    Heart disease Sister     Prior to Admission medications   Medication Sig Start Date End Date Taking? Authorizing Provider  albuterol (PROVENTIL) (5 MG/ML) 0.5% nebulizer solution Take 0.5 mLs (2.5 mg total) by nebulization every 6 (six) hours as needed for wheezing or shortness of breath. 11/09/21   Darrick Grinder, PA-C  albuterol (VENTOLIN HFA) 108 (90 Base) MCG/ACT inhaler Inhale 2 puffs into the lungs every 4 (four) hours as needed for wheezing or shortness of breath. 11/27/21   Park Meo, FNP  Budeson-Glycopyrrol-Formoterol (BREZTRI AEROSPHERE) 160-9-4.8 MCG/ACT AERO Inhale 2 puffs into the lungs 2 (two) times daily. 01/01/21   Donita Brooks, MD  carvedilol (COREG) 6.25 MG tablet Take 6.25 mg by mouth 2 (two) times daily with a meal.    [provider]  lisinopril (ZESTRIL) 20 MG tablet Take 20 mg by mouth daily. 04/13/20   [provider]  prasugrel (EFFIENT) 10 MG TABS tablet Take 10 mg by mouth daily.  08/16/18   [provider]  predniSONE (  DELTASONE) 50 MG tablet Take 1 tablet (50 mg total) by mouth daily. 11/10/21   Dione Booze, MD  rosuvastatin (CRESTOR) 20 MG tablet Take 20 mg by mouth at bedtime.  08/16/18   [provider]    Physical Exam: Vitals:   12/23/21 2200 12/23/21 2210 12/23/21 2215 12/23/21 2230  BP:  110/62  128/74  Pulse:  63 62 64  Resp: 18 17 16 15   Temp:      TempSrc:      SpO2:  99% 100% 98%  Weight:      Height:       Heent: anicteric Neck:: no jvd Heart: rrr s1, s2, no m/g/r Lung: ctab, prolonged exp phase,  Abd: soft, slightly distended, nt, nd, +bs Ext: no c/c/e Skin : no rash, negative cullens sign  Data Reviewed:  CT abd/  pelvis  IMPRESSION: 1. Diffusely distended small bowel loops with decompressed colon, possible ileus or obstruction. No definite transition point is seen. There is questionable swirling at the mesenteric root. Surgical consultation is recommended. 2. Complex hypodensity in the lower pole of the left kidney. Ultrasound is recommended for further characterization. 3. Enlarged prostate gland. 4. Emphysema. 5. Aortic atherosclerosis and coronary artery calcifications.  Urinalysis prot 100 Na 141, K 4.6, Bun 33, Creat 1.62 (gfr 43) Ast 28, Alt 26 Wbc 9.4, Hgb 14.4, Plt 232 Lipase normal    Assessment and Plan: Abdominal pain ileus vs obstruction NPO Hydrate with LR iv NGT to low intermittent suction Surgery consulted by ED, appreciate input from Dr. Lovell Sheehan  Nausea and vomitting Zofran 4mg  iv q6h prn   Hypodensity in the L kidney  renal ultrasound as suggested  Pulmonary  nodule Left lower lung Check CT chest w iv contrst  DVT prophylaxis: Lovenox FULL CODE Dispo: home  Pt will be admitted observation < 2 nites stay for evaluation of ileus vs obstruction, depending upon progress with NGT, may require inpatient status     Advance Care Planning:   Code Status: Prior FULL CODE  Consults: surgery consulted by ED  Family Communication:  with daughter  Severity of Illness: The appropriate patient status for this patient is OBSERVATION. Observation status is judged to be reasonable and necessary in order to provide the required intensity of service to ensure the patient's safety. The patient's presenting symptoms, physical exam findings, and initial radiographic and laboratory data in the context of their medical condition is felt to place them at decreased risk for further clinical deterioration. Furthermore, it is anticipated that the patient will be medically stable for discharge from the hospital within 2 midnights of admission.   Author: Pearson Grippe, MD 12/23/2021 10:59  PM  For on call review [email or web address].

## 2021-12-23 NOTE — ED Provider Notes (Signed)
[facility] EMERGENCY DEPARTMENT Provider Note   CSN: [id number] Arrival date & time: Dec 29, 2021  1637     History  Chief Complaint  Patient presents with   Abdominal Pain    Chad Clements is a 81 y.o. male.  Has a history of cardiac disease COPD and some dementia.  Complaining of midepigastric abdominal pain nausea and vomiting that started last evening.  Family stated he has vomited about 4 times.  Reported diarrhea or fever.  No chest pain or worsening shortness of breath.  No urinary symptoms.  The history is provided by the patient.  Abdominal Pain Pain location:  Epigastric Pain severity:  Moderate Onset quality:  Gradual Duration:  2 days Timing:  Constant Progression:  Unchanged Chronicity:  New Context: not trauma   Relieved by:  None tried Worsened by:  Nothing Ineffective treatments:  None tried Associated symptoms: nausea and vomiting   Associated symptoms: no chest pain, no diarrhea, no dysuria, no fever and no shortness of breath        Home Medications Prior to Admission medications   Medication Sig Start Date End Date Taking? Authorizing Provider  albuterol (PROVENTIL) (5 MG/ML) 0.5% nebulizer solution Take 0.5 mLs (2.5 mg total) by nebulization every 6 (six) hours as needed for wheezing or shortness of breath. 11/09/21   Darrick Grinder, PA-C  albuterol (VENTOLIN HFA) 108 (90 Base) MCG/ACT inhaler Inhale 2 puffs into the lungs every 4 (four) hours as needed for wheezing or shortness of breath. 11/27/21   Park Meo, FNP  Budeson-Glycopyrrol-Formoterol (BREZTRI AEROSPHERE) 160-9-4.8 MCG/ACT AERO Inhale 2 puffs into the lungs 2 (two) times daily. 01/01/21   Donita Brooks, MD  carvedilol (COREG) 6.25 MG tablet Take 6.25 mg by mouth 2 (two) times daily with a meal.    [provider]  lisinopril (ZESTRIL) 20 MG tablet Take 20 mg by mouth daily. 04/13/20   [provider]  prasugrel (EFFIENT) 10 MG TABS tablet Take 10 mg by mouth  daily.  08/16/18   [provider]  predniSONE (DELTASONE) 50 MG tablet Take 1 tablet (50 mg total) by mouth daily. 11/10/21   Dione Booze, MD  rosuvastatin (CRESTOR) 20 MG tablet Take 20 mg by mouth at bedtime.  08/16/18   [provider]      Allergies    No known allergies    Review of Systems   Review of Systems  Constitutional:  Negative for fever.  Respiratory:  Negative for shortness of breath.   Cardiovascular:  Negative for chest pain.  Gastrointestinal:  Positive for abdominal pain, nausea and vomiting. Negative for diarrhea.  Genitourinary:  Negative for dysuria.    Physical Exam Updated Vital Signs BP 107/68 (BP Location: Right Arm)   Pulse 88   Temp 98.3 F (36.8 C) (Oral)   Resp 16   Ht 5\' 10"  (1.778 m)   Wt 61.2 kg   SpO2 100%   BMI 19.37 kg/m  Physical Exam Vitals and nursing note reviewed.  Constitutional:      General: He is not in acute distress.    Appearance: Normal appearance. He is well-developed.  HENT:     Head: Normocephalic and atraumatic.  Eyes:     Conjunctiva/sclera: Conjunctivae normal.  Cardiovascular:     Rate and Rhythm: Normal rate and regular rhythm.     Heart sounds: No murmur heard. Pulmonary:     Effort: Pulmonary effort is normal. No respiratory distress.     Breath  sounds: Normal breath sounds.  Abdominal:     Palpations: Abdomen is soft.     Tenderness: There is no abdominal tenderness. There is no guarding or rebound.  Musculoskeletal:        General: No deformity.     Cervical back: Neck supple.     Right lower leg: No edema.     Left lower leg: No edema.  Skin:    General: Skin is warm and dry.     Capillary Refill: Capillary refill takes less than 2 seconds.  Neurological:     General: No focal deficit present.     Mental Status: He is alert.     ED Results / Procedures / Treatments   Labs (all labs ordered are listed, but only abnormal results are displayed) Labs Reviewed  COMPREHENSIVE  METABOLIC PANEL - Abnormal; Notable for the following components:      Result Value   Glucose, Bld 137 (*)    BUN 33 (*)    Creatinine, Ser 1.62 (*)    GFR, Estimated 43 (*)    All other components within normal limits  URINALYSIS, ROUTINE W REFLEX MICROSCOPIC - Abnormal; Notable for the following components:   Glucose, UA 50 (*)    Ketones, ur 5 (*)    Protein, ur 100 (*)    All other components within normal limits  BASIC METABOLIC PANEL - Abnormal; Notable for the following components:   Sodium 134 (*)    CO2 14 (*)    Glucose, Bld 66 (*)    Calcium 7.0 (*)    All other components within normal limits  CBC - Abnormal; Notable for the following components:   RBC 3.24 (*)    Hemoglobin 9.6 (*)    HCT 30.1 (*)    Platelets 139 (*)    All other components within normal limits  LIPASE, BLOOD  CBC  MAGNESIUM    EKG EKG Interpretation  Date/Time:  Monday December 23 2021 21:34:39 EST Ventricular Rate:  73 PR Interval:    QRS Duration: 115 QT Interval:  404 QTC Calculation: 446 R Axis:   65 Text Interpretation: Normal sinus rhythm Probable inferior infarct, old Minimal ST elevation, anterior leads Lateral leads are also involved Artifact in lead(s) I III aVR aVL aVF V1 V2 V6 Confirmed by Meridee Score [phone]) on 12/23/2021 9:44:09 PM  Radiology CT CHEST WO CONTRAST  Result Date: 12/24/2021 CLINICAL DATA:  Follow-up pulmonary nodule on prior CT EXAM: CT CHEST WITHOUT CONTRAST TECHNIQUE: Multidetector CT imaging of the chest was performed following the standard protocol without IV contrast. RADIATION DOSE REDUCTION: This exam was performed according to the departmental dose-optimization program which includes automated exposure control, adjustment of the mA and/or kV according to patient size and/or use of iterative reconstruction technique. COMPARISON:  CT chest dated 12/20/2020. FINDINGS: Cardiovascular: Heart is normal size.  No pericardial effusion. No evidence of thoracic  aortic aneurysm. Atherosclerotic calcifications of the arch. Three vessel coronary atherosclerosis. Mediastinum/Nodes: No suspicious mediastinal lymphadenopathy. Visualized thyroid is unremarkable. Lungs/Pleura: Mild centrilobular and paraseptal emphysematous changes, upper lung predominant. Bronchiectasis bronchial wall thickening in the bilateral lower lobes. Scattered areas of chronic mucous plugging in the left lower lobe. No focal consolidation. 3 mm subpleural left lower lobe pulmonary nodule (series 4/image 112), grossly unchanged, benign. No follow-up is recommended per Fleischner Society guidelines. No new/suspicious pulmonary nodules. No pleural effusion or pneumothorax. Upper Abdomen: Visualized upper abdomen is unchanged from CT abdomen/pelvis dated 12/23/2021. Musculoskeletal: Visualized osseous structures are  within normal limits. IMPRESSION: No evidence of acute cardiopulmonary disease. 3 mm subpleural left lower lobe pulmonary nodule, grossly unchanged from 2022, benign. No follow-up imaging is recommended per Fleischner Society guidelines. Aortic Atherosclerosis (ICD10-I70.0) and Emphysema (ICD10-J43.9). Electronically Signed   By: Charline Bills M.D.   On: 12/24/2021 03:39   DG Chest Portable 1 View  Result Date: 12/23/2021 CLINICAL DATA:  NG tube placement EXAM: PORTABLE CHEST 1 VIEW COMPARISON:  12/23/2021 FINDINGS: Lung fields are clear. Esophageal tube tip remains folded back upon itself with the tip directed cranial over the distal esophageal region. IMPRESSION: Esophageal tube tip remains folded back upon itself with the tip directed cranial over the distal esophageal region. Electronically Signed   By: Jasmine Pang M.D.   On: 12/23/2021 23:43   DG Chest Portable 1 View  Result Date: 12/23/2021 CLINICAL DATA:  NG tube placement EXAM: PORTABLE CHEST 1 VIEW COMPARISON:  11/09/2021, CT 12/23/2021 FINDINGS: Esophageal tube is folded back upon itself with the tip directed cranial  over the distal esophagus. No acute airspace disease. Normal cardiac size. No pneumothorax. Dilated loops of bowel in the upper abdomen. IMPRESSION: Esophageal tube is folded back upon itself with the tip directed cranial over the distal esophagus. Electronically Signed   By: Jasmine Pang M.D.   On: 12/23/2021 23:24   CT Abdomen Pelvis W Contrast  Result Date: 12/23/2021 CLINICAL DATA:  Abdominal pain, acute, nonlocalized. EXAM: CT ABDOMEN AND PELVIS WITH CONTRAST TECHNIQUE: Multidetector CT imaging of the abdomen and pelvis was performed using the standard protocol following bolus administration of intravenous contrast. RADIATION DOSE REDUCTION: This exam was performed according to the departmental dose-optimization program which includes automated exposure control, adjustment of the mA and/or kV according to patient size and/or use of iterative reconstruction technique. CONTRAST:  75mL OMNIPAQUE IOHEXOL 300 MG/ML  SOLN COMPARISON:  None Available. FINDINGS: Lower chest: Coronary artery calcifications. Emphysematous changes are present at the lung bases. Hepatobiliary: A few scattered hypodensities are noted in the left lobe of the liver, measuring up to 1.2 cm, statistically most likely representing cysts or hemangiomas. No biliary ductal dilatation. Gallbladder is without stones. Pancreas: Unremarkable. No pancreatic ductal dilatation or surrounding inflammatory changes. Spleen: The spleen is normal in size and scattered hypodensities are noted, possible cysts or hemangiomas. Adrenals/Urinary Tract: The adrenal glands are within normal limits. Cysts are present in the kidneys bilaterally. There is a complex hypodensity in the lower pole of the left kidney measuring 1.4 cm. No renal calculus or hydronephrosis. The bladder is unremarkable. Stomach/Bowel: The stomach is within normal limits. A small bowel is diffusely distended measuring up to 4 cm in diameter. No pneumatosis or free air. The colon is  decompressed and the appendix is not visualized on exam. No focal bowel wall thickening. There is mild swirling of the mesenteric root, best seen on axial image 39. Vascular/Lymphatic: Aortic atherosclerosis. No enlarged abdominal or pelvic lymph nodes. Reproductive: Prostate gland is enlarged. Other: No intra-abdominal free fluid Musculoskeletal: Degenerative changes in the thoracolumbar spine. No acute osseous abnormality. IMPRESSION: 1. Diffusely distended small bowel loops with decompressed colon, possible ileus or obstruction. No definite transition point is seen. There is questionable swirling at the mesenteric root. Surgical consultation is recommended. 2. Complex hypodensity in the lower pole of the left kidney. Ultrasound is recommended for further characterization. 3. Enlarged prostate gland. 4. Emphysema. 5. Aortic atherosclerosis and coronary artery calcifications. Critical Value/emergent results were called by telephone at the time of interpretation on 12/23/2021 at 10:07  pm to provider Meridee Score , who verbally acknowledged these results. Electronically Signed   By: Thornell Sartorius M.D.   On: 12/23/2021 22:07    Procedures Procedures    Medications Ordered in ED Medications  sodium chloride 0.9 % bolus 500 mL (has no administration in time range)  ondansetron (ZOFRAN) injection 4 mg (has no administration in time range)  morphine (PF) 4 MG/ML injection 4 mg (has no administration in time range)    ED Course/ Medical Decision Making/ A&P Clinical Course as of 12/24/21 0930  Mon Dec 23, 2021  2232 Discussed with Dr. Lovell Sheehan from general surgery.  He felt NG and IV fluids, bowel rest, no antibiotics at this time, can stay at [facility] hospitalist to admit and he will see in a.m.  Reviewed with daughter and she is comfortable plan for admission. [MB]  2301 Discussed with Dr. Selena Batten [facility] hospitalist who will evaluate patient for admission. [MB]    Clinical Course User Index [MB] Terrilee Files, MD                           Medical Decision Making Amount and/or Complexity of Data Reviewed Labs: ordered. Radiology: ordered.  Risk Prescription drug management. Decision regarding hospitalization.   This patient complains of central abdominal pain nausea vomiting; this involves an extensive number of treatment Options and is a complaint that carries with it a high risk of complications and morbidity. The differential includes obstruction, hernia, gastroenteritis, diverticulitis, colitis, a GE  I ordered, reviewed and interpreted labs, which included CBC with normal white count normal hemoglobin, chemistries with normal bicarb, mild AKI, urinalysis without clear signs of infection I ordered medication IV fluids nausea medication pain medication and reviewed PMP when indicated. I ordered imaging studies which included chest x-ray, CT abdomen and pelvis and I independently    visualized and interpreted imaging which showed possible small bowel obstruction no clear transition point.  Incidental renal lesion Additional history obtained from patient's daughter Previous records obtained and reviewed in epic, no recent admissions I consulted general surgery Dr. Lovell Sheehan and [facility] hospitalist Dr. Selena Batten and discussed lab and imaging findings and discussed disposition.  Cardiac monitoring reviewed, normal sinus rhythm Social determinants considered, ongoing tobacco use Critical Interventions: None  After the interventions stated above, I reevaluated the patient and found patient to be fairly comfortable and hemodynamically stable Admission and further testing considered, he would benefit from admission and further workup of his abdominal pain.  I have asked the nurses to place an NG tube.         Final Clinical Impression(s) / ED Diagnoses Final diagnoses:  Small bowel obstruction [facility])    Rx / DC Orders ED Discharge Orders     None         Terrilee Files,  MD 12/24/21 [phone]

## 2021-12-23 NOTE — ED Triage Notes (Signed)
Pt c/o lower abdominal pain since yesterday with multiple episodes of emesis. Pt's daughter is with pt as he has memory issues.

## 2021-12-24 ENCOUNTER — Other Ambulatory Visit: Payer: Self-pay

## 2021-12-24 ENCOUNTER — Inpatient Hospital Stay (HOSPITAL_COMMUNITY): Payer: Medicare Other

## 2021-12-24 ENCOUNTER — Observation Stay (HOSPITAL_COMMUNITY): Payer: Medicare Other

## 2021-12-24 DIAGNOSIS — F17210 Nicotine dependence, cigarettes, uncomplicated: Secondary | ICD-10-CM | POA: Diagnosis present

## 2021-12-24 DIAGNOSIS — Z66 Do not resuscitate: Secondary | ICD-10-CM | POA: Diagnosis not present

## 2021-12-24 DIAGNOSIS — N1831 Chronic kidney disease, stage 3a: Secondary | ICD-10-CM | POA: Diagnosis present

## 2021-12-24 DIAGNOSIS — Z515 Encounter for palliative care: Secondary | ICD-10-CM | POA: Diagnosis not present

## 2021-12-24 DIAGNOSIS — Z825 Family history of asthma and other chronic lower respiratory diseases: Secondary | ICD-10-CM | POA: Diagnosis not present

## 2021-12-24 DIAGNOSIS — J9601 Acute respiratory failure with hypoxia: Secondary | ICD-10-CM | POA: Diagnosis not present

## 2021-12-24 DIAGNOSIS — I252 Old myocardial infarction: Secondary | ICD-10-CM | POA: Diagnosis not present

## 2021-12-24 DIAGNOSIS — R112 Nausea with vomiting, unspecified: Secondary | ICD-10-CM | POA: Diagnosis present

## 2021-12-24 DIAGNOSIS — N179 Acute kidney failure, unspecified: Secondary | ICD-10-CM | POA: Diagnosis present

## 2021-12-24 DIAGNOSIS — Z79899 Other long term (current) drug therapy: Secondary | ICD-10-CM | POA: Diagnosis not present

## 2021-12-24 DIAGNOSIS — R1114 Bilious vomiting: Secondary | ICD-10-CM | POA: Diagnosis not present

## 2021-12-24 DIAGNOSIS — C3490 Malignant neoplasm of unspecified part of unspecified bronchus or lung: Secondary | ICD-10-CM | POA: Diagnosis present

## 2021-12-24 DIAGNOSIS — E872 Acidosis, unspecified: Secondary | ICD-10-CM | POA: Diagnosis present

## 2021-12-24 DIAGNOSIS — K56609 Unspecified intestinal obstruction, unspecified as to partial versus complete obstruction: Secondary | ICD-10-CM | POA: Diagnosis present

## 2021-12-24 DIAGNOSIS — R911 Solitary pulmonary nodule: Secondary | ICD-10-CM | POA: Diagnosis present

## 2021-12-24 DIAGNOSIS — I129 Hypertensive chronic kidney disease with stage 1 through stage 4 chronic kidney disease, or unspecified chronic kidney disease: Secondary | ICD-10-CM | POA: Diagnosis present

## 2021-12-24 DIAGNOSIS — R1084 Generalized abdominal pain: Secondary | ICD-10-CM | POA: Diagnosis not present

## 2021-12-24 DIAGNOSIS — F03911 Unspecified dementia, unspecified severity, with agitation: Secondary | ICD-10-CM | POA: Diagnosis present

## 2021-12-24 DIAGNOSIS — Z681 Body mass index (BMI) 19 or less, adult: Secondary | ICD-10-CM | POA: Diagnosis not present

## 2021-12-24 DIAGNOSIS — Z955 Presence of coronary angioplasty implant and graft: Secondary | ICD-10-CM | POA: Diagnosis not present

## 2021-12-24 DIAGNOSIS — I2510 Atherosclerotic heart disease of native coronary artery without angina pectoris: Secondary | ICD-10-CM | POA: Diagnosis present

## 2021-12-24 DIAGNOSIS — Z8249 Family history of ischemic heart disease and other diseases of the circulatory system: Secondary | ICD-10-CM | POA: Diagnosis not present

## 2021-12-24 DIAGNOSIS — Z818 Family history of other mental and behavioral disorders: Secondary | ICD-10-CM | POA: Diagnosis not present

## 2021-12-24 DIAGNOSIS — E440 Moderate protein-calorie malnutrition: Secondary | ICD-10-CM | POA: Diagnosis present

## 2021-12-24 DIAGNOSIS — E785 Hyperlipidemia, unspecified: Secondary | ICD-10-CM | POA: Diagnosis present

## 2021-12-24 DIAGNOSIS — Z7952 Long term (current) use of systemic steroids: Secondary | ICD-10-CM | POA: Diagnosis not present

## 2021-12-24 DIAGNOSIS — Z7951 Long term (current) use of inhaled steroids: Secondary | ICD-10-CM | POA: Diagnosis not present

## 2021-12-24 LAB — CBC
HCT: 30.1 % — ABNORMAL LOW (ref 39.0–52.0)
Hemoglobin: 9.6 g/dL — ABNORMAL LOW (ref 13.0–17.0)
MCH: 29.6 pg (ref 26.0–34.0)
MCHC: 31.9 g/dL (ref 30.0–36.0)
MCV: 92.9 fL (ref 80.0–100.0)
Platelets: 139 10*3/uL — ABNORMAL LOW (ref 150–400)
RBC: 3.24 MIL/uL — ABNORMAL LOW (ref 4.22–5.81)
RDW: 13.9 % (ref 11.5–15.5)
WBC: 7.1 10*3/uL (ref 4.0–10.5)
nRBC: 0 % (ref 0.0–0.2)

## 2021-12-24 LAB — LACTIC ACID, PLASMA
Lactic Acid, Venous: 2.9 mmol/L (ref 0.5–1.9)
Lactic Acid, Venous: 2.1 mmol/L (ref 0.5–1.9)

## 2021-12-24 LAB — BASIC METABOLIC PANEL
Anion gap: 14 (ref 5–15)
BUN: 22 mg/dL (ref 8–23)
CO2: 14 mmol/L — ABNORMAL LOW (ref 22–32)
Calcium: 7 mg/dL — ABNORMAL LOW (ref 8.9–10.3)
Chloride: 106 mmol/L (ref 98–111)
Creatinine, Ser: 0.83 mg/dL (ref 0.61–1.24)
GFR, Estimated: >60 mL/min (ref >60)
Glucose, Bld: 66 mg/dL — ABNORMAL LOW (ref 70–99)
Potassium: 4.4 mmol/L (ref 3.5–5.1)
Sodium: 134 mmol/L — ABNORMAL LOW (ref 135–145)

## 2021-12-24 LAB — LACTATE DEHYDROGENASE: LDH: 197 U/L — ABNORMAL HIGH (ref 98–192)

## 2021-12-24 LAB — MAGNESIUM: Magnesium: 2.2 mg/dL (ref 1.7–2.4)

## 2021-12-24 MED ORDER — ENOXAPARIN SODIUM 40 MG/0.4ML IJ SOSY
40.0000 mg | PREFILLED_SYRINGE | INTRAMUSCULAR | Status: DC
  Administered 2021-12-24 – 2021-12-25 (×2): 40 mg via SUBCUTANEOUS
  Filled 2021-12-24 (×3): qty 0.4

## 2021-12-24 MED ORDER — SODIUM BICARBONATE 8.4 % IV SOLN
INTRAVENOUS | Status: DC
  Filled 2021-12-24 (×5): qty 1000

## 2021-12-24 MED ORDER — ROSUVASTATIN CALCIUM 20 MG PO TABS: 20.0000 mg | ORAL_TABLET | Freq: Every day | ORAL | Status: DC

## 2021-12-24 MED ORDER — MOMETASONE FURO-FORMOTEROL FUM 100-5 MCG/ACT IN AERO
2.0000 | INHALATION_SPRAY | Freq: Two times a day (BID) | RESPIRATORY_TRACT | Status: DC
  Administered 2021-12-24 – 2021-12-26 (×6): 2 via RESPIRATORY_TRACT
  Filled 2021-12-24: qty 8.8

## 2021-12-24 MED ORDER — LISINOPRIL 10 MG PO TABS
20.0000 mg | ORAL_TABLET | Freq: Every day | ORAL | Status: DC
  Administered 2021-12-25: 20 mg via ORAL
  Filled 2021-12-24 (×2): qty 2

## 2021-12-24 MED ORDER — DIATRIZOATE MEGLUMINE & SODIUM 66-10 % PO SOLN
ORAL | Status: AC
  Filled 2021-12-24: qty 90

## 2021-12-24 MED ORDER — ACETAMINOPHEN 325 MG PO TABS: 650.0000 mg | ORAL_TABLET | Freq: Four times a day (QID) | ORAL | Status: DC | PRN

## 2021-12-24 MED ORDER — BUDESON-GLYCOPYRROL-FORMOTEROL 160-9-4.8 MCG/ACT IN AERO: 2.0000 | INHALATION_SPRAY | Freq: Two times a day (BID) | RESPIRATORY_TRACT | Status: DC

## 2021-12-24 MED ORDER — MORPHINE SULFATE (PF) 2 MG/ML IV SOLN
1.0000 mg | INTRAVENOUS | Status: DC | PRN
  Administered 2021-12-24: 1 mg via INTRAVENOUS
  Filled 2021-12-24: qty 1

## 2021-12-24 MED ORDER — ACETAMINOPHEN 650 MG RE SUPP: 650.0000 mg | Freq: Four times a day (QID) | RECTAL | Status: DC | PRN

## 2021-12-24 MED ORDER — DIPHENHYDRAMINE HCL 50 MG/ML IJ SOLN: 25.0000 mg | Freq: Four times a day (QID) | INTRAMUSCULAR | Status: DC | PRN

## 2021-12-24 MED ORDER — ONDANSETRON HCL 4 MG/2ML IJ SOLN
4.0000 mg | Freq: Four times a day (QID) | INTRAMUSCULAR | Status: DC | PRN
  Administered 2021-12-24 (×2): 4 mg via INTRAVENOUS
  Filled 2021-12-24 (×2): qty 2

## 2021-12-24 MED ORDER — MORPHINE SULFATE (PF) 2 MG/ML IV SOLN
1.0000 mg | INTRAVENOUS | Status: DC | PRN
  Administered 2021-12-24 – 2021-12-27 (×4): 1 mg via INTRAVENOUS
  Filled 2021-12-24 (×4): qty 1

## 2021-12-24 MED ORDER — DIATRIZOATE MEGLUMINE & SODIUM 66-10 % PO SOLN
90.0000 mL | Freq: Once | ORAL | Status: AC
  Administered 2021-12-24: 90 mL via NASOGASTRIC
  Filled 2021-12-24: qty 90

## 2021-12-24 MED ORDER — UMECLIDINIUM BROMIDE 62.5 MCG/ACT IN AEPB
1.0000 | INHALATION_SPRAY | Freq: Every day | RESPIRATORY_TRACT | Status: DC
  Administered 2021-12-24 – 2021-12-26 (×3): 1 via RESPIRATORY_TRACT
  Filled 2021-12-24: qty 7

## 2021-12-24 MED ORDER — LACTATED RINGERS IV SOLN: INTRAVENOUS | Status: DC

## 2021-12-24 MED ORDER — ALBUTEROL SULFATE HFA 108 (90 BASE) MCG/ACT IN AERS
2.0000 | INHALATION_SPRAY | RESPIRATORY_TRACT | Status: DC | PRN
  Administered 2021-12-25: 2 via RESPIRATORY_TRACT
  Filled 2021-12-24: qty 6.7

## 2021-12-24 MED ORDER — CARVEDILOL 3.125 MG PO TABS
6.2500 mg | ORAL_TABLET | Freq: Two times a day (BID) | ORAL | Status: DC
  Administered 2021-12-24 – 2021-12-25 (×2): 6.25 mg via ORAL
  Filled 2021-12-24 (×2): qty 2

## 2021-12-24 NOTE — Consult Note (Signed)
Reason for Consult: Bowel obstruction versus ileus Referring Physician: Dr. Lovell Sheehan  Chad Clements is an 81 y.o. male.  HPI: Patient is an 81 year old black male with a history of significant COPD and recently diagnosed lung cancer who presented to [facility] with worsening abdominal pain, nausea, and vomiting.  CT scan of the abdomen pelvis revealed a possible ileus or obstructive process, though no transition zone was seen.  There was a question of swirling of the mesentery, but there was no pneumatosis or bowel wall thickening.  The patient has been in the emergency room waiting for a floor bed.  They did attempt NG tube placement but this was not successful.  Patient states his abdominal pain has decreased and his abdomen is less distended and softer.  I could not get a history as to whether he has had a bowel movement or passed flatus.  He has never had abdominal surgery.  Past Medical History:  Diagnosis Date   Benign tumor of parotid gland    excision 2017 ([place]in's tumor)   Cancer ([place])    lung cancer dx in august   COPD (chronic obstructive pulmonary disease) ([place])    Coronary artery disease    Hyperlipidemia    Hyperlipidemia    Hypertension    MI (myocardial infarction) ([place]) 1992, 1998   has 2 stents ([place]) instent restenosis of 3rd OM 2010. Dr. Orpah Greek   Pulmonary nodule    Shortness of breath dyspnea    Smoker     Past Surgical History:  Procedure Laterality Date   CARDIAC CATHETERIZATION     heart stents     HEMATOMA EVACUATION  08/22/2015   Procedure: EVACUATION HEMATOMA;  Surgeon: Jerrell Belfast, MD;  Location: [facility];  Service: ENT;;   PAROTIDECTOMY Right 08/22/2015   Procedure: RIGHT PAROTID MASS;  Surgeon: Jerrell Belfast, MD;  Location: [facility] OR;  Service: ENT;  Laterality: Right;   TONSILLECTOMY      Family History  Problem Relation Age of Onset   Mental illness Brother    Heart disease Mother    COPD Father    Heart disease Sister     Heart disease Sister     Social History:  reports that he has been smoking cigarettes. He has been smoking an average of .5 packs per day. He has never used smokeless tobacco. He reports that he does not drink alcohol and does not use drugs.  Allergies:  Allergies  Allergen Reactions   No Known Allergies     Medications: Prior to Admission: (Not in a hospital admission)  Effient anticoagulation  Results for orders placed or performed during the hospital encounter of 12/13/2021 (from the past 48 hour(s))  Urinalysis, Routine w reflex microscopic Urine, Clean Catch     Status: Abnormal   Collection Time: 12/12/2021  5:25 PM  Result Value Ref Range   Color, Urine YELLOW YELLOW   APPearance CLEAR CLEAR   Specific Gravity, Urine 1.021 1.005 - 1.030   pH 5.0 5.0 - 8.0   Glucose, UA 50 (A) NEGATIVE mg/dL   Hgb urine dipstick NEGATIVE NEGATIVE   Bilirubin Urine NEGATIVE NEGATIVE   Ketones, ur 5 (A) NEGATIVE mg/dL   Protein, ur 100 (A) NEGATIVE mg/dL   Nitrite NEGATIVE NEGATIVE   Leukocytes,Ua NEGATIVE NEGATIVE   RBC / HPF 0-5 0 - 5 RBC/hpf   WBC, UA 0-5 0 - 5 WBC/hpf   Bacteria, UA NONE SEEN NONE SEEN   Squamous Epithelial /  LPF 0-5 0 - 5   Mucus PRESENT     Comment: Performed at [facility], [address], [place], [place] [address]  Lipase, blood     Status: None   Collection Time: 01/01/2022  5:51 PM  Result Value Ref Range   Lipase 31 11 - 51 U/L    Comment: Performed at [facility], [address] [place], [place] [address]  Comprehensive metabolic panel     Status: Abnormal   Collection Time: 12/29/2021  5:51 PM  Result Value Ref Range   Sodium 141 135 - 145 mmol/L   Potassium 4.6 3.5 - 5.1 mmol/L   Chloride 101 98 - 111 mmol/L   CO2 26 22 - 32 mmol/L   Glucose, Bld 137 (H) 70 - 99 mg/dL    Comment: Glucose reference range applies only to samples taken after fasting for at least 8 hours.   BUN 33 (H) 8 - 23 mg/dL   Creatinine, Ser 1.62 (H) 0.61 - 1.24 mg/dL   Calcium  10.3 8.9 - 10.3 mg/dL   Total Protein 7.8 6.5 - 8.1 g/dL   Albumin 4.4 3.5 - 5.0 g/dL   AST 28 15 - 41 U/L   ALT 26 0 - 44 U/L   Alkaline Phosphatase 65 38 - 126 U/L   Total Bilirubin 0.6 0.3 - 1.2 mg/dL   GFR, Estimated 43 (L) >60 mL/min    Comment: (NOTE) Calculated using the CKD-EPI Creatinine Equation (2021)    Anion gap 14 5 - 15    Comment: Performed at [facility], [address], [place], [place] [address]  CBC     Status: None   Collection Time: 01/04/2022  5:51 PM  Result Value Ref Range   WBC 9.4 4.0 - 10.5 K/uL   RBC 4.92 4.22 - 5.81 MIL/uL   Hemoglobin 14.4 13.0 - 17.0 g/dL   HCT 44.5 39.0 - 52.0 %   MCV 90.4 80.0 - 100.0 fL   MCH 29.3 26.0 - 34.0 pg   MCHC 32.4 30.0 - 36.0 g/dL   RDW 13.9 11.5 - 15.5 %   Platelets 232 150 - 400 K/uL   nRBC 0.0 0.0 - 0.2 %    Comment: Performed at [facility], [address] [place], [place] [address]  Basic metabolic panel     Status: Abnormal   Collection Time: 12/24/21  4:31 AM  Result Value Ref Range   Sodium 134 (L) 135 - 145 mmol/L    Comment: DELTA CHECK NOTED   Potassium 4.4 3.5 - 5.1 mmol/L   Chloride 106 98 - 111 mmol/L   CO2 14 (L) 22 - 32 mmol/L   Glucose, Bld 66 (L) 70 - 99 mg/dL    Comment: Glucose reference range applies only to samples taken after fasting for at least 8 hours.   BUN 22 8 - 23 mg/dL   Creatinine, Ser 0.83 0.61 - 1.24 mg/dL   Calcium 7.0 (L) 8.9 - 10.3 mg/dL    Comment: DELTA CHECK NOTED   GFR, Estimated >60 >60 mL/min    Comment: (NOTE) Calculated using the CKD-EPI Creatinine Equation (2021)    Anion gap 14 5 - 15    Comment: Performed at [facility], [address], [place],  [address]  CBC     Status: Abnormal   Collection Time: 12/24/21  4:31 AM  Result Value Ref Range   WBC 7.1 4.0 - 10.5 K/uL   RBC 3.24 (L) 4.22 - 5.81 MIL/uL  Hemoglobin 9.6 (L) 13.0 - 17.0 g/dL    Comment: DELTA CHECK NOTED   HCT 30.1 (L) 39.0 - 52.0 %   MCV 92.9 80.0 - 100.0 fL   MCH 29.6 26.0 - 34.0 pg    MCHC 31.9 30.0 - 36.0 g/dL   RDW 13.9 11.5 - 15.5 %   Platelets 139 (L) 150 - 400 K/uL   nRBC 0.0 0.0 - 0.2 %    Comment: Performed at [facility], [address] [place], [place] [address]  Magnesium     Status: None   Collection Time: 12/24/21  4:31 AM  Result Value Ref Range   Magnesium 2.2 1.7 - 2.4 mg/dL    Comment: Performed at [facility], [address] [place], [place] [address]    CT CHEST WO CONTRAST  Result Date: 12/24/2021 CLINICAL DATA:  Follow-up pulmonary nodule on prior CT EXAM: CT CHEST WITHOUT CONTRAST TECHNIQUE: Multidetector CT imaging of the chest was performed following the standard protocol without IV contrast. RADIATION DOSE REDUCTION: This exam was performed according to the departmental dose-optimization program which includes automated exposure control, adjustment of the mA and/or kV according to patient size and/or use of iterative reconstruction technique. COMPARISON:  CT chest dated 12/20/2020. FINDINGS: Cardiovascular: Heart is normal size.  No pericardial effusion. No evidence of thoracic aortic aneurysm. Atherosclerotic calcifications of the arch. Three vessel coronary atherosclerosis. Mediastinum/Nodes: No suspicious mediastinal lymphadenopathy. Visualized thyroid is unremarkable. Lungs/Pleura: Mild centrilobular and paraseptal emphysematous changes, upper lung predominant. Bronchiectasis bronchial wall thickening in the bilateral lower lobes. Scattered areas of chronic mucous plugging in the left lower lobe. No focal consolidation. 3 mm subpleural left lower lobe pulmonary nodule (series 4/image 112), grossly unchanged, benign. No follow-up is recommended per Fleischner Society guidelines. No new/suspicious pulmonary nodules. No pleural effusion or pneumothorax. Upper Abdomen: Visualized upper abdomen is unchanged from CT abdomen/pelvis dated 12/14/2021. Musculoskeletal: Visualized osseous structures are within normal limits. IMPRESSION: No evidence of acute  cardiopulmonary disease. 3 mm subpleural left lower lobe pulmonary nodule, grossly unchanged from 2022, benign. No follow-up imaging is recommended per Fleischner Society guidelines. Aortic Atherosclerosis (ICD10-I70.0) and Emphysema (ICD10-J43.9). Electronically Signed   By: Julian Hy M.D.   On: 12/24/2021 03:39   DG Chest Portable 1 View  Result Date: 12/17/2021 CLINICAL DATA:  NG tube placement EXAM: PORTABLE CHEST 1 VIEW COMPARISON:  12/10/2021 FINDINGS: Lung fields are clear. Esophageal tube tip remains folded back upon itself with the tip directed cranial over the distal esophageal region. IMPRESSION: Esophageal tube tip remains folded back upon itself with the tip directed cranial over the distal esophageal region. Electronically Signed   By: Donavan Foil M.D.   On: 01/04/2022 23:43   DG Chest Portable 1 View  Result Date: 12/08/2021 CLINICAL DATA:  NG tube placement EXAM: PORTABLE CHEST 1 VIEW COMPARISON:  11/09/2021, CT 12/12/2021 FINDINGS: Esophageal tube is folded back upon itself with the tip directed cranial over the distal esophagus. No acute airspace disease. Normal cardiac size. No pneumothorax. Dilated loops of bowel in the upper abdomen. IMPRESSION: Esophageal tube is folded back upon itself with the tip directed cranial over the distal esophagus. Electronically Signed   By: Donavan Foil M.D.   On: 12/22/2021 23:24   CT Abdomen Pelvis W Contrast  Result Date: 12/21/2021 CLINICAL DATA:  Abdominal pain, acute, nonlocalized. EXAM: CT ABDOMEN AND PELVIS WITH CONTRAST TECHNIQUE: Multidetector CT imaging of the abdomen and pelvis was performed using the standard protocol following bolus administration of intravenous contrast.  RADIATION DOSE REDUCTION: This exam was performed according to the departmental dose-optimization program which includes automated exposure control, adjustment of the mA and/or kV according to patient size and/or use of iterative reconstruction technique.  CONTRAST:  53mL OMNIPAQUE IOHEXOL 300 MG/ML  SOLN COMPARISON:  None Available. FINDINGS: Lower chest: Coronary artery calcifications. Emphysematous changes are present at the lung bases. Hepatobiliary: A few scattered hypodensities are noted in the left lobe of the liver, measuring up to 1.2 cm, statistically most likely representing cysts or hemangiomas. No biliary ductal dilatation. Gallbladder is without stones. Pancreas: Unremarkable. No pancreatic ductal dilatation or surrounding inflammatory changes. Spleen: The spleen is normal in size and scattered hypodensities are noted, possible cysts or hemangiomas. Adrenals/Urinary Tract: The adrenal glands are within normal limits. Cysts are present in the kidneys bilaterally. There is a complex hypodensity in the lower pole of the left kidney measuring 1.4 cm. No renal calculus or hydronephrosis. The bladder is unremarkable. Stomach/Bowel: The stomach is within normal limits. A small bowel is diffusely distended measuring up to 4 cm in diameter. No pneumatosis or free air. The colon is decompressed and the appendix is not visualized on exam. No focal bowel wall thickening. There is mild swirling of the mesenteric root, best seen on axial image 39. Vascular/Lymphatic: Aortic atherosclerosis. No enlarged abdominal or pelvic lymph nodes. Reproductive: Prostate gland is enlarged. Other: No intra-abdominal free fluid Musculoskeletal: Degenerative changes in the thoracolumbar spine. No acute osseous abnormality. IMPRESSION: 1. Diffusely distended small bowel loops with decompressed colon, possible ileus or obstruction. No definite transition point is seen. There is questionable swirling at the mesenteric root. Surgical consultation is recommended. 2. Complex hypodensity in the lower pole of the left kidney. Ultrasound is recommended for further characterization. 3. Enlarged prostate gland. 4. Emphysema. 5. Aortic atherosclerosis and coronary artery calcifications. Critical  Value/emergent results were called by telephone at the time of interpretation on 12/16/2021 at 10:07 pm to provider [facility] , who verbally acknowledged these results. Electronically Signed   By: Brett Fairy M.D.   On: 12/15/2021 22:07    ROS:  Pertinent items are noted in HPI.  Blood pressure (!) 119/91, pulse 76, temperature 98 F (36.7 C), temperature source Axillary, resp. rate 16, height 5\' 10"  (1.778 m), weight 61.2 kg, SpO2 97 %. Physical Exam: Pleasant black male no acute distress Head is normocephalic, atraumatic Lungs with distant sounds noted. Heart examination reveals regular rate and rhythm without S3, S4, murmurs Abdomen is soft and flat with occasional bowel sounds appreciated.  No specific point tenderness noted.  No rigidity is noted.  CT scan images personally reviewed  Assessment/Plan: Impression: Bowel obstruction versus ileus.  No significant evidence of an acute abdomen at the present time.  The patient's bicarbonate is noted to be lower this morning.  The etiology of which is unknown.  As he does not have any significant abdominal pain at rest, ischemic bowel is less likely but still a consideration.  As his abdominal examination is improved, we will just monitor at the present time.  I did discuss with the patient and a family member the risk of taking him to surgery as he would require mechanical ventilation and he has significant pulmonary issues.  Discussed with Dr. Aileen Fass.  Unless the patient starts having significant emesis, no need to try to attempt NG tube placement at the present time.  Will follow with you.  Aviva Signs 12/24/2021, 10:07 AM

## 2021-12-24 NOTE — Plan of Care (Signed)
  Problem: Health Behavior/Discharge Planning: Goal: Ability to manage health-related needs will improve Outcome: Progressing   

## 2021-12-24 NOTE — Progress Notes (Signed)
[facility] PROGRESS NOTE    Progress Note  Chad Clements  TWS:[id number] DOB: 08/12/1940 DOA: 12/22/2021 PCP: Susy Frizzle, MD     Brief Narrative:   Chad Clements is an 81 y.o. male past medical history significant for hypertension hyperlipidemia CAD with a pulmonary nodule in December of last year and a hypodensity in the left kidney presented with abdominal pain nausea and vomiting that started the day prior to admission, CT of the abdomen pelvis showed possible ileus or obstruction no definite transition point a complex hypodensity in the left lower kidney and enlarged prostate.  CT of the chest showed no acute cardiopulmonary disease.General surgery was consulted who recommended conservative management and they will consult in the morning   Assessment/Plan:   Ileus/small bowel obstruction: Patient is currently not vomiting but feels nauseated we will give Zofran. Will start him on a small bowel protocol. Will place an NG tube although this was successfully attempted overnight and it was removed due to improper placement staff to try again. Keep him n.p.o., IV fluids try to keep potassium greater than 4 magnesium greater than 5. Out of bed to chair consult physical therapy. Awaiting surgery further recommendations.  Hypodensity of the left kidney: Renal ultrasound has been sent.  Left lower lobe pulmonary nodule:  seen on CT which is unchanged from previous. Hold oral medications.  Moderate protein caloric malnutrition: Noted.  DVT prophylaxis: lovenox Family Communication:none Status is: Observation The patient will require care spanning > 2 midnights and should be moved to inpatient because: SBO    Code Status:     Code Status Orders  (From admission, onward)           Start     Ordered   12/24/21 0121  Full code  Continuous       Question:  By:  Answer:  Consent: discussion documented in EHR   12/24/21 0124           Code  Status History     Date Active Date Inactive Code Status Order ID Comments User Context   08/22/2015 1704 08/23/2015 1409 Full Code [id number]  Jerrell Belfast, MD Inpatient         IV Access:   Peripheral IV   Procedures and diagnostic studies:   CT CHEST WO CONTRAST  Result Date: 12/24/2021 CLINICAL DATA:  Follow-up pulmonary nodule on prior CT EXAM: CT CHEST WITHOUT CONTRAST TECHNIQUE: Multidetector CT imaging of the chest was performed following the standard protocol without IV contrast. RADIATION DOSE REDUCTION: This exam was performed according to the departmental dose-optimization program which includes automated exposure control, adjustment of the mA and/or kV according to patient size and/or use of iterative reconstruction technique. COMPARISON:  CT chest dated 12/20/2020. FINDINGS: Cardiovascular: Heart is normal size.  No pericardial effusion. No evidence of thoracic aortic aneurysm. Atherosclerotic calcifications of the arch. Three vessel coronary atherosclerosis. Mediastinum/Nodes: No suspicious mediastinal lymphadenopathy. Visualized thyroid is unremarkable. Lungs/Pleura: Mild centrilobular and paraseptal emphysematous changes, upper lung predominant. Bronchiectasis bronchial wall thickening in the bilateral lower lobes. Scattered areas of chronic mucous plugging in the left lower lobe. No focal consolidation. 3 mm subpleural left lower lobe pulmonary nodule (series 4/image 112), grossly unchanged, benign. No follow-up is recommended per Fleischner Society guidelines. No new/suspicious pulmonary nodules. No pleural effusion or pneumothorax. Upper Abdomen: Visualized upper abdomen is unchanged from CT abdomen/pelvis dated 12/31/2021. Musculoskeletal: Visualized osseous structures are within normal limits. IMPRESSION: No evidence of acute cardiopulmonary disease. 3 mm subpleural  left lower lobe pulmonary nodule, grossly unchanged from 2022, benign. No follow-up imaging is recommended  per Fleischner Society guidelines. Aortic Atherosclerosis (ICD10-I70.0) and Emphysema (ICD10-J43.9). Electronically Signed   By: Julian Hy M.D.   On: 12/24/2021 03:39   DG Chest Portable 1 View  Result Date: 12/28/2021 CLINICAL DATA:  NG tube placement EXAM: PORTABLE CHEST 1 VIEW COMPARISON:  12/13/2021 FINDINGS: Lung fields are clear. Esophageal tube tip remains folded back upon itself with the tip directed cranial over the distal esophageal region. IMPRESSION: Esophageal tube tip remains folded back upon itself with the tip directed cranial over the distal esophageal region. Electronically Signed   By: Donavan Foil M.D.   On: 12/28/2021 23:43   DG Chest Portable 1 View  Result Date: 12/31/2021 CLINICAL DATA:  NG tube placement EXAM: PORTABLE CHEST 1 VIEW COMPARISON:  11/09/2021, CT 12/19/2021 FINDINGS: Esophageal tube is folded back upon itself with the tip directed cranial over the distal esophagus. No acute airspace disease. Normal cardiac size. No pneumothorax. Dilated loops of bowel in the upper abdomen. IMPRESSION: Esophageal tube is folded back upon itself with the tip directed cranial over the distal esophagus. Electronically Signed   By: Donavan Foil M.D.   On: 12/12/2021 23:24   CT Abdomen Pelvis W Contrast  Result Date: 12/13/2021 CLINICAL DATA:  Abdominal pain, acute, nonlocalized. EXAM: CT ABDOMEN AND PELVIS WITH CONTRAST TECHNIQUE: Multidetector CT imaging of the abdomen and pelvis was performed using the standard protocol following bolus administration of intravenous contrast. RADIATION DOSE REDUCTION: This exam was performed according to the departmental dose-optimization program which includes automated exposure control, adjustment of the mA and/or kV according to patient size and/or use of iterative reconstruction technique. CONTRAST:  76mL OMNIPAQUE IOHEXOL 300 MG/ML  SOLN COMPARISON:  None Available. FINDINGS: Lower chest: Coronary artery calcifications. Emphysematous  changes are present at the lung bases. Hepatobiliary: A few scattered hypodensities are noted in the left lobe of the liver, measuring up to 1.2 cm, statistically most likely representing cysts or hemangiomas. No biliary ductal dilatation. Gallbladder is without stones. Pancreas: Unremarkable. No pancreatic ductal dilatation or surrounding inflammatory changes. Spleen: The spleen is normal in size and scattered hypodensities are noted, possible cysts or hemangiomas. Adrenals/Urinary Tract: The adrenal glands are within normal limits. Cysts are present in the kidneys bilaterally. There is a complex hypodensity in the lower pole of the left kidney measuring 1.4 cm. No renal calculus or hydronephrosis. The bladder is unremarkable. Stomach/Bowel: The stomach is within normal limits. A small bowel is diffusely distended measuring up to 4 cm in diameter. No pneumatosis or free air. The colon is decompressed and the appendix is not visualized on exam. No focal bowel wall thickening. There is mild swirling of the mesenteric root, best seen on axial image 39. Vascular/Lymphatic: Aortic atherosclerosis. No enlarged abdominal or pelvic lymph nodes. Reproductive: Prostate gland is enlarged. Other: No intra-abdominal free fluid Musculoskeletal: Degenerative changes in the thoracolumbar spine. No acute osseous abnormality. IMPRESSION: 1. Diffusely distended small bowel loops with decompressed colon, possible ileus or obstruction. No definite transition point is seen. There is questionable swirling at the mesenteric root. Surgical consultation is recommended. 2. Complex hypodensity in the lower pole of the left kidney. Ultrasound is recommended for further characterization. 3. Enlarged prostate gland. 4. Emphysema. 5. Aortic atherosclerosis and coronary artery calcifications. Critical Value/emergent results were called by telephone at the time of interpretation on 12/10/2021 at 10:07 pm to provider [facility] , who verbally  acknowledged these results. Electronically  Signed   By: Brett Fairy M.D.   On: 01/01/2022 22:07     Medical Consultants:   None.   Subjective:    NHIA HEAPHY relates he still nauseated and has not vomited.  Objective:    Vitals:   12/24/21 0430 12/24/21 0500 12/24/21 0530 12/24/21 0600  BP: 113/64 124/75 109/85 128/80  Pulse: 73 73 73 71  Resp: 18 16 20 16   Temp:      TempSrc:      SpO2: 97% 98% 98% 97%  Weight:      Height:       SpO2: 97 %   Intake/Output Summary (Last 24 hours) at 12/24/2021 0623 Last data filed at 12/24/2021 0355 Gross per 24 hour  Intake 517.78 ml  Output --  Net 517.78 ml   Filed Weights   12/07/2021 1721  Weight: 61.2 kg    Exam: General exam: In no acute distress. Respiratory system: Good air movement and clear to auscultation. Cardiovascular system: S1 & S2 heard, RRR. No JVD. Gastrointestinal system: Abdomen is soft, distended right lower quadrant tenderness with no rebound or guarding Extremities: No pedal edema. Skin: No rashes, lesions or ulcers Psychiatry: Judgement and insight appear normal. Mood & affect appropriate.    Data Reviewed:    Labs: Basic Metabolic Panel: Recent Labs  Lab 12/15/2021 1751  NA 141  K 4.6  CL 101  CO2 26  GLUCOSE 137*  BUN 33*  CREATININE 1.62*  CALCIUM 10.3   GFR Estimated Creatinine Clearance: 31.5 mL/min (A) (by C-G formula based on SCr of 1.62 mg/dL (H)). Liver Function Tests: Recent Labs  Lab 01/04/2022 1751  AST 28  ALT 26  ALKPHOS 65  BILITOT 0.6  PROT 7.8  ALBUMIN 4.4   Recent Labs  Lab 12/13/2021 1751  LIPASE 31   No results for input(s): "AMMONIA" in the last 168 hours. Coagulation profile No results for input(s): "INR", "PROTIME" in the last 168 hours. COVID-19 Labs  No results for input(s): "DDIMER", "FERRITIN", "LDH", "CRP" in the last 72 hours.  Lab Results  Component Value Date   SARSCOV2NAA NEGATIVE 12/20/2020   [place] NEGATIVE 12/04/2019     CBC: Recent Labs  Lab 12/13/2021 1751  WBC 9.4  HGB 14.4  HCT 44.5  MCV 90.4  PLT 232   Cardiac Enzymes: No results for input(s): "CKTOTAL", "CKMB", "CKMBINDEX", "TROPONINI" in the last 168 hours. BNP (last 3 results) No results for input(s): "PROBNP" in the last 8760 hours. CBG: No results for input(s): "GLUCAP" in the last 168 hours. D-Dimer: No results for input(s): "DDIMER" in the last 72 hours. Hgb A1c: No results for input(s): "HGBA1C" in the last 72 hours. Lipid Profile: No results for input(s): "CHOL", "HDL", "LDLCALC", "TRIG", "CHOLHDL", "LDLDIRECT" in the last 72 hours. Thyroid function studies: No results for input(s): "TSH", "T4TOTAL", "T3FREE", "THYROIDAB" in the last 72 hours.  Invalid input(s): "FREET3" Anemia work up: No results for input(s): "VITAMINB12", "FOLATE", "FERRITIN", "TIBC", "IRON", "RETICCTPCT" in the last 72 hours. Sepsis Labs: Recent Labs  Lab 12/10/2021 1751  WBC 9.4   Microbiology No results found for this or any previous visit (from the past 240 hour(s)).   Medications:    carvedilol  6.25 mg Oral BID WC   enoxaparin (LOVENOX) injection  40 mg Subcutaneous Q24H   lisinopril  20 mg Oral Daily   mometasone-formoterol  2 puff Inhalation BID   rosuvastatin  20 mg Oral QHS   umeclidinium bromide  1 puff Inhalation  Daily   Continuous Infusions:  lactated ringers 100 mL/hr at 12/24/21 0358      LOS: 0 days   Charlynne Cousins  [facility]  12/24/2021, 6:23 AM

## 2021-12-24 NOTE — Progress Notes (Signed)
PT Cancellation Note  Patient Details Name: Chad Clements MRN: [id number] DOB: 02-05-1940   Cancelled Treatment:    Reason Eval/Treat Not Completed: Patient at procedure or test/unavailable.  Patient having ultrasound.  Will check back tomorrow.   3:26 PM, 12/24/21 Lonell Grandchild, MPT Physical Therapist with [facility] 336 [phone] office [phone] mobile phone

## 2021-12-24 NOTE — ED Notes (Signed)
NG tube removed due to improper placement and multiple attempts by other staff to place properly/ Maudie Mercury, MD at bedside and witnessed attempts/ pt denies or nausea at this time.

## 2021-12-24 NOTE — TOC Progression Note (Signed)
  Transition of Care [facility]) Screening Note   Patient Details  Name: Chad Clements Date of Birth: 1940-09-29   Transition of Care [facility] CM/SW Contact:    Shade Flood, LCSW Phone Number: 12/24/2021, 10:33 AM    Transition of Care Department [facility]) has reviewed patient and no TOC needs have been identified at this time. We will continue to monitor patient advancement through interdisciplinary progression rounds. If new patient transition needs arise, please place a TOC consult.

## 2021-12-25 ENCOUNTER — Inpatient Hospital Stay (HOSPITAL_COMMUNITY): Payer: Medicare Other

## 2021-12-25 DIAGNOSIS — N1831 Chronic kidney disease, stage 3a: Secondary | ICD-10-CM | POA: Diagnosis not present

## 2021-12-25 DIAGNOSIS — K56609 Unspecified intestinal obstruction, unspecified as to partial versus complete obstruction: Secondary | ICD-10-CM | POA: Diagnosis not present

## 2021-12-25 LAB — BASIC METABOLIC PANEL
Anion gap: 12 (ref 5–15)
BUN: 42 mg/dL — ABNORMAL HIGH (ref 8–23)
CO2: 32 mmol/L (ref 22–32)
Calcium: 9.2 mg/dL (ref 8.9–10.3)
Chloride: 97 mmol/L — ABNORMAL LOW (ref 98–111)
Creatinine, Ser: 1.47 mg/dL — ABNORMAL HIGH (ref 0.61–1.24)
GFR, Estimated: 48 mL/min — ABNORMAL LOW (ref >60)
Glucose, Bld: 154 mg/dL — ABNORMAL HIGH (ref 70–99)
Potassium: 5.1 mmol/L (ref 3.5–5.1)
Sodium: 141 mmol/L (ref 135–145)

## 2021-12-25 LAB — CBC
HCT: 41.3 % (ref 39.0–52.0)
Hemoglobin: 13.4 g/dL (ref 13.0–17.0)
MCH: 29.3 pg (ref 26.0–34.0)
MCHC: 32.4 g/dL (ref 30.0–36.0)
MCV: 90.4 fL (ref 80.0–100.0)
Platelets: 213 10*3/uL (ref 150–400)
RBC: 4.57 MIL/uL (ref 4.22–5.81)
RDW: 14.2 % (ref 11.5–15.5)
WBC: 8.2 10*3/uL (ref 4.0–10.5)
nRBC: 0 % (ref 0.0–0.2)

## 2021-12-25 MED ORDER — HYDRALAZINE HCL 20 MG/ML IJ SOLN: 10.0000 mg | INTRAMUSCULAR | Status: DC | PRN

## 2021-12-25 MED ORDER — SODIUM CHLORIDE 0.9 % IV SOLN: INTRAVENOUS | Status: DC

## 2021-12-25 MED ORDER — LABETALOL HCL 5 MG/ML IV SOLN: 10.0000 mg | INTRAVENOUS | Status: DC | PRN

## 2021-12-25 MED ORDER — BISACODYL 10 MG RE SUPP
10.0000 mg | Freq: Once | RECTAL | Status: AC
  Administered 2021-12-25: 10 mg via RECTAL
  Filled 2021-12-25: qty 1

## 2021-12-25 NOTE — Progress Notes (Signed)
Subjective: Patient denies any abdominal pain.  An NG tube was placed.  Objective: Vital signs in last 24 hours: Temp:  [97.6 F (36.4 C)-99.4 F (37.4 C)] 98.2 F (36.8 C) (12/20 0412) Pulse Rate:  [73-91] 85 (12/20 0412) Resp:  [17-26] 18 (12/20 0412) BP: (123-140)/(80-94) 132/89 (12/20 0918) SpO2:  [92 %-100 %] 99 % (12/20 0719) Weight:  [60.8 kg] 60.8 kg (12/20 0412) Last BM Date : 12/19/21  Intake/Output from previous day: 12/19 0701 - 12/20 0700 In: 2819.2 [I.V.:2819.2] Out: 600 [Emesis/NG output:600] Intake/Output this shift: No intake/output data recorded.  General appearance: alert and cooperative GI: Soft with upper abdominal distention.  No rigidity noted.  No tenderness noted.  It was noted the NG tube was not working properly.  I could not clear the NG tube, thus it was replaced with a 16 [place] tube.  Is now evacuating appropriately.  Lab Results:  Recent Labs    12/24/21 0431 12/25/21 0442  WBC 7.1 8.2  HGB 9.6* 13.4  HCT 30.1* 41.3  PLT 139* 213   BMET Recent Labs    12/24/21 0431 12/25/21 0442  NA 134* 141  K 4.4 5.1  CL 106 97*  CO2 14* 32  GLUCOSE 66* 154*  BUN 22 42*  CREATININE 0.83 1.47*  CALCIUM 7.0* 9.2   PT/INR No results for input(s): "LABPROT", "INR" in the last 72 hours.  Studies/Results: DG Abd Portable 1V-Small Bowel Protocol-Position Verification  Result Date: 12/24/2021 CLINICAL DATA:  Nasogastric tube placement, KUB for small bowel obstruction protocol EXAM: PORTABLE ABDOMEN - 1 VIEW COMPARISON:  Portable exam 1112 hours compared to CT exam of 01/05/2022 FINDINGS: Nasogastric tube within stomach at fundus, stomach decompressed. Numerous dilated small bowel loops in the upper and mid abdomen. Paucity of colonic gas. No bowel wall thickening. Lung bases clear. Coronary stent noted. Excreted contrast material within renal collecting systems, proximal ureters, and bladder. No osseous findings. IMPRESSION: Small-bowel  obstruction. Electronically Signed   By: Lavonia Dana M.D.   On: 12/24/2021 11:25   US RENAL  Result Date: 12/24/2021 CLINICAL DATA:  Renal lesion EXAM: RENAL / URINARY TRACT ULTRASOUND COMPLETE COMPARISON:  None Available. FINDINGS: Right Kidney: Length: 9.9 cm. Echogenicity within normal limits. Upper pole cyst measures 4.2 cm. No stones or Hydronephrosis. Left Kidney: Length: 8.8 cm. Echogenicity within normal limits. Extrarenal pelvis. No renal parenchymal lesions or hydronephrosis. There is evidence of possible pleural effusions. Fluid-filled bowel loops identified as an incidental finding in the pelvis and upper abdomen. IMPRESSION: 1. No hydronephrosis or shadowing stones identified. 2. Right kidney cyst. 3. Extrarenal pelvis on the left. 4. Possible bilateral pleural effusions. 5. Fluid-filled dilated bowel. Electronically Signed   By: Sammie Bench M.D.   On: 12/24/2021 10:38   CT CHEST WO CONTRAST  Result Date: 12/24/2021 CLINICAL DATA:  Follow-up pulmonary nodule on prior CT EXAM: CT CHEST WITHOUT CONTRAST TECHNIQUE: Multidetector CT imaging of the chest was performed following the standard protocol without IV contrast. RADIATION DOSE REDUCTION: This exam was performed according to the departmental dose-optimization program which includes automated exposure control, adjustment of the mA and/or kV according to patient size and/or use of iterative reconstruction technique. COMPARISON:  CT chest dated 12/20/2020. FINDINGS: Cardiovascular: Heart is normal size.  No pericardial effusion. No evidence of thoracic aortic aneurysm. Atherosclerotic calcifications of the arch. Three vessel coronary atherosclerosis. Mediastinum/Nodes: No suspicious mediastinal lymphadenopathy. Visualized thyroid is unremarkable. Lungs/Pleura: Mild centrilobular and paraseptal emphysematous changes, upper lung predominant. Bronchiectasis bronchial wall thickening in  the bilateral lower lobes. Scattered areas of chronic  mucous plugging in the left lower lobe. No focal consolidation. 3 mm subpleural left lower lobe pulmonary nodule (series 4/image 112), grossly unchanged, benign. No follow-up is recommended per Fleischner Society guidelines. No new/suspicious pulmonary nodules. No pleural effusion or pneumothorax. Upper Abdomen: Visualized upper abdomen is unchanged from CT abdomen/pelvis dated 12/30/2021. Musculoskeletal: Visualized osseous structures are within normal limits. IMPRESSION: No evidence of acute cardiopulmonary disease. 3 mm subpleural left lower lobe pulmonary nodule, grossly unchanged from 2022, benign. No follow-up imaging is recommended per Fleischner Society guidelines. Aortic Atherosclerosis (ICD10-I70.0) and Emphysema (ICD10-J43.9). Electronically Signed   By: Julian Hy M.D.   On: 12/24/2021 03:39   DG Chest Portable 1 View  Result Date: 12/09/2021 CLINICAL DATA:  NG tube placement EXAM: PORTABLE CHEST 1 VIEW COMPARISON:  12/20/2021 FINDINGS: Lung fields are clear. Esophageal tube tip remains folded back upon itself with the tip directed cranial over the distal esophageal region. IMPRESSION: Esophageal tube tip remains folded back upon itself with the tip directed cranial over the distal esophageal region. Electronically Signed   By: Donavan Foil M.D.   On: 12/08/2021 23:43   DG Chest Portable 1 View  Result Date: 12/20/2021 CLINICAL DATA:  NG tube placement EXAM: PORTABLE CHEST 1 VIEW COMPARISON:  11/09/2021, CT 12/17/2021 FINDINGS: Esophageal tube is folded back upon itself with the tip directed cranial over the distal esophagus. No acute airspace disease. Normal cardiac size. No pneumothorax. Dilated loops of bowel in the upper abdomen. IMPRESSION: Esophageal tube is folded back upon itself with the tip directed cranial over the distal esophagus. Electronically Signed   By: Donavan Foil M.D.   On: 12/12/2021 23:24   CT Abdomen Pelvis W Contrast  Result Date: 12/13/2021 CLINICAL DATA:   Abdominal pain, acute, nonlocalized. EXAM: CT ABDOMEN AND PELVIS WITH CONTRAST TECHNIQUE: Multidetector CT imaging of the abdomen and pelvis was performed using the standard protocol following bolus administration of intravenous contrast. RADIATION DOSE REDUCTION: This exam was performed according to the departmental dose-optimization program which includes automated exposure control, adjustment of the mA and/or kV according to patient size and/or use of iterative reconstruction technique. CONTRAST:  38mL OMNIPAQUE IOHEXOL 300 MG/ML  SOLN COMPARISON:  None Available. FINDINGS: Lower chest: Coronary artery calcifications. Emphysematous changes are present at the lung bases. Hepatobiliary: A few scattered hypodensities are noted in the left lobe of the liver, measuring up to 1.2 cm, statistically most likely representing cysts or hemangiomas. No biliary ductal dilatation. Gallbladder is without stones. Pancreas: Unremarkable. No pancreatic ductal dilatation or surrounding inflammatory changes. Spleen: The spleen is normal in size and scattered hypodensities are noted, possible cysts or hemangiomas. Adrenals/Urinary Tract: The adrenal glands are within normal limits. Cysts are present in the kidneys bilaterally. There is a complex hypodensity in the lower pole of the left kidney measuring 1.4 cm. No renal calculus or hydronephrosis. The bladder is unremarkable. Stomach/Bowel: The stomach is within normal limits. A small bowel is diffusely distended measuring up to 4 cm in diameter. No pneumatosis or free air. The colon is decompressed and the appendix is not visualized on exam. No focal bowel wall thickening. There is mild swirling of the mesenteric root, best seen on axial image 39. Vascular/Lymphatic: Aortic atherosclerosis. No enlarged abdominal or pelvic lymph nodes. Reproductive: Prostate gland is enlarged. Other: No intra-abdominal free fluid Musculoskeletal: Degenerative changes in the thoracolumbar spine. No  acute osseous abnormality. IMPRESSION: 1. Diffusely distended small bowel loops with decompressed colon, possible  ileus or obstruction. No definite transition point is seen. There is questionable swirling at the mesenteric root. Surgical consultation is recommended. 2. Complex hypodensity in the lower pole of the left kidney. Ultrasound is recommended for further characterization. 3. Enlarged prostate gland. 4. Emphysema. 5. Aortic atherosclerosis and coronary artery calcifications. Critical Value/emergent results were called by telephone at the time of interpretation on 12/28/2021 at 10:07 pm to provider [facility] , who verbally acknowledged these results. Electronically Signed   By: Brett Fairy M.D.   On: 12/10/2021 22:07    Anti-infectives: Anti-infectives (From admission, onward)    None       Assessment/Plan: Impression: Small bowel obstruction, possible ileus, less likely mechanical.  Today's labs are remarkably different than yesterday's.  I was wondering whether yesterday's BMET was errant.  His white count is still normal.  He does have renal insufficiency.  Discussed with Dr. Sabino Gasser.  No need for acute surgical invention at the present time.  Will continue to follow.  LOS: 1 day    Aviva Signs 12/25/2021

## 2021-12-25 NOTE — Progress Notes (Signed)
Patient placed on 3lpm cannula due to spo2 86% on room air rr 30 hr 111 nurse informed

## 2021-12-25 NOTE — Assessment & Plan Note (Signed)
-   Home meds on hold in setting of SBO - Use labetalol or hydralazine PRN

## 2021-12-25 NOTE — Progress Notes (Signed)
Progress Note    Chad Clements   MVE:[id number]  DOB: 10-02-1940  DOA: 12/20/2021     1 PCP: Susy Frizzle, MD  Initial CC: abdominal pain  Hospital Course: Chad Clements is an 81 yo male with PMH HTN, HLD, CAD who presented with abdominal pain and N/V.  CT abdomen/pelvis was performed on admission which showed diffusely distended small bowel loops with decompressed colon concerning for ileus or obstruction.  There was no definitive transition point seen on imaging.  He was admitted for further workup and surgery evaluation.  Interval History:  Poor output from NG tube this morning.  Patient being evaluated by Dr. Arnoldo Morale when seen this morning as well.  Abdomen distended with ongoing abdominal pain.  Some nausea but no active vomiting.  Assessment and Plan: * Small bowel obstruction (HCC) - Differential includes questionable obstruction versus ileus.  No transition zone was noted on initial CT on admission.  Repeat abdominal imaging has shown persistently dilated small bowel loops in the upper and mid abdomen with paucity of colonic gas -NG tube not adequately decompressing and was repositioned by surgery this morning - continue monitoring output from NGT - keep NPO - pain/nausea control - continue IVF  Chronic kidney disease, stage 3a ([place]) - patient has history of CKD3A. Baseline creat ~ 1.3, eGFR 54 -Renal function remaining at baseline.  Suspected erroneous BMP from 12/24/2021 which showed a severe metabolic acidosis - Discontinue bicarb drip - Continue NS drip   Nausea and vomiting - Suspected from SBO - Continue supportive care and decompression  Hypertension - Home meds on hold in setting of SBO - Use labetalol or hydralazine PRN  Hyperlipidemia - Home meds on hold in setting of SBO   Old records reviewed in assessment of this patient  Antimicrobials:   DVT prophylaxis:  enoxaparin (LOVENOX) injection 40 mg Start: 12/24/21 0800 SCDs Start: 12/24/21  0249   Code Status:   Code Status: Full Code  Mobility Assessment (last 72 hours)     Mobility Assessment     Row Name 12/25/21 1000 12/24/21 2300 12/24/21 2037       Does patient have an order for bedrest or is patient medically unstable No - Continue assessment No - Continue assessment No - Continue assessment     What is the highest level of mobility based on the progressive mobility assessment? Level 4 (Walks with assist in room) - Balance while marching in place and cannot step forward and back - Complete Level 4 (Walks with assist in room) - Balance while marching in place and cannot step forward and back - Complete Level 4 (Walks with assist in room) - Balance while marching in place and cannot step forward and back - Complete     Is the above level different from baseline mobility prior to current illness? -- -- No - Consider discontinuing PT/OT              Barriers to discharge:  Disposition Plan:  Home 2-3 days Status is: Inpt  Objective: Blood pressure 132/76, pulse 80, temperature 98.5 F (36.9 C), temperature source Oral, resp. rate 19, height _0  (1.778 m), weight 60.8 kg, SpO2 98 %.  Examination:  Physical Exam Constitutional:      General: He is not in acute distress.    Comments: Uncomfortable appearing from abdominal distention and some nausea  HENT:     Head: Normocephalic and atraumatic.     Mouth/Throat:     Mouth: Mucous  membranes are moist.  Eyes:     Extraocular Movements: Extraocular movements intact.  Cardiovascular:     Rate and Rhythm: Normal rate and regular rhythm.  Pulmonary:     Effort: Pulmonary effort is normal. No respiratory distress.     Breath sounds: Normal breath sounds.  Abdominal:     Comments: Moderately distended with hypoactive bowel sounds.  Generalized tenderness to palpation throughout  Musculoskeletal:        General: Normal range of motion.     Cervical back: Normal range of motion and neck supple.  Skin:     General: Skin is warm and dry.  Neurological:     General: No focal deficit present.     Mental Status: He is alert and oriented to person, place, and time.  Psychiatric:        Mood and Affect: Mood normal.        Behavior: Behavior normal.      Consultants:  General surgery  Procedures:    Data Reviewed: Results for orders placed or performed during the hospital encounter of 12/07/2021 (from the past 24 hour(s))  Lactic acid, plasma     Status: Abnormal   Collection Time: 12/24/21  4:46 PM  Result Value Ref Range   Lactic Acid, Venous 2.1 (HH) 0.5 - 1.9 mmol/L  Basic metabolic panel     Status: Abnormal   Collection Time: 12/25/21  4:42 AM  Result Value Ref Range   Sodium 141 135 - 145 mmol/L   Potassium 5.1 3.5 - 5.1 mmol/L   Chloride 97 (L) 98 - 111 mmol/L   CO2 32 22 - 32 mmol/L   Glucose, Bld 154 (H) 70 - 99 mg/dL   BUN 42 (H) 8 - 23 mg/dL   Creatinine, Ser 1.47 (H) 0.61 - 1.24 mg/dL   Calcium 9.2 8.9 - 10.3 mg/dL   GFR, Estimated 48 (L) >60 mL/min   Anion gap 12 5 - 15  CBC     Status: None   Collection Time: 12/25/21  4:42 AM  Result Value Ref Range   WBC 8.2 4.0 - 10.5 K/uL   RBC 4.57 4.22 - 5.81 MIL/uL   Hemoglobin 13.4 13.0 - 17.0 g/dL   HCT 41.3 39.0 - 52.0 %   MCV 90.4 80.0 - 100.0 fL   MCH 29.3 26.0 - 34.0 pg   MCHC 32.4 30.0 - 36.0 g/dL   RDW 14.2 11.5 - 15.5 %   Platelets 213 150 - 400 K/uL   nRBC 0.0 0.0 - 0.2 %    I have Reviewed nursing notes, Vitals, and Lab results since pt's last encounter. Pertinent lab results : see above I have ordered labwork to follow up on.  I have reviewed the last note from staff over past 24 hours I have discussed pt's care plan and test results with nursing staff, CM/SW, and other staff as appropriate  Time spent: Greater than 50% of the 55 minute visit was spent in counseling/coordination of care for the patient as laid out in the A&P.   LOS: 1 day   Dwyane Dee, MD [facility] 12/25/2021, 3:29 PM

## 2021-12-25 NOTE — Assessment & Plan Note (Signed)
-  patient has history of CKD3A. Baseline creat ~ 1.3, eGFR 54 -Renal function remaining at baseline.  Suspected erroneous BMP from 12/24/2021 which showed a severe metabolic acidosis - Discontinue bicarb drip - Continue NS drip

## 2021-12-25 NOTE — TOC Initial Note (Signed)
Transition of Care [facility]) - Initial/Assessment Note    Patient Details  Name: Chad Clements MRN: [id number] Date of Birth: Dec 01, 1940  Transition of [facility] CM/SW Contact:    Chad Clements, [place] Phone Number: 12/25/2021, 3:32 PM  Clinical Narrative:   Pt admitted due to abdominal pain- ileus vs obstruction. Assessment completed with pt at bedside. Pt reports he lives alone and is independent with ADLs. His family assist with grocery shopping and some meals. Pt ambulates with a cane at baseline. He drives himself to appointments. PT evaluated pt and recommend home health. Pt is agreeable with no preference on agency. Chad Clements with [facility] accepts for HHPT. Will need order. TOC will continue to follow.                  Planned Disposition: Home with Health Care Svc Barriers to Discharge: Continued Medical Work up   Patient Goals and CMS Choice Patient states their goals for this hospitalization and ongoing recovery are:: return home   Choice offered to / list presented to : Patient [place] ownership interest in [facility].provided to::  (N/a)    Expected Discharge Plan and Services Planned Disposition: Home with Health Care Svc In-house Referral: Clinical Social Work   Post Acute Care Choice: [place] arrangements for the past 2 months: [place]: PT [place]: [place] Date [facility] Agency Contacted: 12/25/21 Time HH Agency Contacted: 72 Representative spoke with at [place]: Tommi Rumps  Prior Living Arrangements/Services Living arrangements for the past 2 months: [place] with:: Self Patient language and need for interpreter reviewed:: Yes Do you feel safe going back to the place where you live?: Yes      Need for Family Participation in Patient Care: No (Comment)   Current home services: DME (cane) Criminal Activity/Legal Involvement Pertinent to Current  Situation/Hospitalization: No - Comment as needed  Activities of Daily Living Home Assistive Devices/Equipment: None ADL Screening (condition at time of admission) Patient's cognitive ability adequate to safely complete daily activities?: Yes Is the patient deaf or have difficulty hearing?: No Does the patient have difficulty seeing, even when wearing glasses/contacts?: No Does the patient have difficulty concentrating, remembering, or making decisions?: Yes Patient able to express need for assistance with ADLs?: Yes Does the patient have difficulty dressing or bathing?: No Independently performs ADLs?: Yes (appropriate for developmental age) Does the patient have difficulty walking or climbing stairs?: Yes Weakness of Legs: Both Weakness of Arms/Hands: None  Permission Sought/Granted                  Emotional Assessment     Affect (typically observed): Appropriate Orientation: : Oriented to Self, Oriented to Place, Oriented to Situation, Oriented to  Time Alcohol / Substance Use: Not Applicable Psych Involvement: No (comment)  Admission diagnosis:  Small bowel obstruction ([place]) [K56.609] SBO (small bowel obstruction) ([place]) [K56.609] Abdominal pain [R10.9] Patient Active Problem List   Diagnosis Date Noted   Chronic kidney disease, stage 3a ([place]) 12/25/2021   Nausea and vomiting 12/24/2021   Small bowel obstruction ([place]) 12/24/2021   Abdominal pain 12/07/2021   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 [place] [address] Phone: [phone] Fax: [phone]  [facility] [place], [address] [place] [address] Phone: [phone] Fax: [phone]     Social Determinants of Health (SDOH) Social History: SDOH Screenings   Food Insecurity: No Food Insecurity (12/24/2021)  Housing: Low Risk  (12/24/2021)  Transportation Needs: No Transportation Needs (12/24/2021)  Utilities: Not At Risk (12/24/2021)  Alcohol Screen: Low Risk  (09/26/2021)  Depression (PHQ2-9): Low Risk  (09/26/2021)  Financial Resource Strain: Low Risk  (09/26/2021)  Physical Activity: Insufficiently Active (09/26/2021)  Social Connections: Moderately Integrated (09/26/2021)  Stress: No Stress Concern Present (09/26/2021)  Tobacco Use: High Risk (12/25/2021)   SDOH Interventions:     Readmission Risk Interventions     No data to display

## 2021-12-25 NOTE — Evaluation (Signed)
Physical Therapy Evaluation Patient Details Name: Chad Clements MRN: [id number] DOB: [birth year] Today's Date: 12/25/2021  History of Present Illness  Chad Clements is a 81 y.o. 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   Pt denies fever, chills,  diarrhea, constipation, brbpr, black stool, dysuria, hematuria.  ED consulted general surgery Dr. Arnoldo Morale who per ED recommended NGT to low intermittent suction as well as NPO  Surgery will be by to see the patient in the morning per Dr. Melina Copa   Clinical Impression  Patient demonstrates good return for sitting up at bedside and transferring to chair without use of AD, but limited for attempting ambulation away from bedside due to c/o fatigue.  Patient tolerated sitting up in chair after therapy.  Patient will benefit from continued skilled physical therapy in hospital and recommended venue below to increase strength, balance, endurance for safe ADLs and gait.         Recommendations for follow up therapy are one component of a multi-disciplinary discharge planning process, led by the attending physician.  Recommendations may be updated based on patient status, additional functional criteria and insurance authorization.  Follow Up Recommendations Home health PT      Assistance Recommended at Discharge Set up Supervision/Assistance  Patient can return home with the following  A little help with walking and/or transfers;A little help with bathing/dressing/bathroom;Help with stairs or ramp for entrance;Assistance with cooking/housework    Equipment Recommendations Rolling walker (2 wheels)  Recommendations for Other Services       Functional Status Assessment Patient has had a recent decline in their functional status and demonstrates the ability to make  significant improvements in function in a reasonable and predictable amount of time.     Precautions / Restrictions Precautions Precautions: Fall Restrictions Weight Bearing Restrictions: No      Mobility  Bed Mobility Overal bed mobility: Needs Assistance Bed Mobility: Supine to Sit     Supine to sit: Supervision     General bed mobility comments: slightly labored movement    Transfers Overall transfer level: Needs assistance Equipment used: None Transfers: Sit to/from Stand, Bed to chair/wheelchair/BSC Sit to Stand: Supervision   Step pivot transfers: Supervision       General transfer comment: slightly labored movement    Ambulation/Gait Ambulation/Gait assistance: Supervision, Min guard Gait Distance (Feet): 4 Feet Assistive device: None Gait Pattern/deviations: Decreased step length - right, Decreased step length - left, Decreased stride length Gait velocity: decreased     General Gait Details: limited to a few steps at bedside before having to sit due to fatigue  Stairs            Wheelchair Mobility    Modified Rankin (Stroke Patients Only)       Balance Overall balance assessment: Needs assistance Sitting-balance support: Feet supported, No upper extremity supported Sitting balance-Leahy Scale: Good Sitting balance - Comments: seated at EOB   Standing balance support: During functional activity, No upper extremity supported Standing balance-Leahy Scale: Fair Standing balance comment: without use of an AD                             Pertinent Vitals/Pain Pain Assessment Pain Assessment: No/denies pain    Home Living Family/patient expects to be discharged to:: Private residence  Living Arrangements: Alone Available Help at Discharge: Family;Available PRN/intermittently Type of Home: House Home Access: Stairs to enter Entrance Stairs-Rails: Right Entrance Stairs-Number of Steps: 5   Home Layout: One level Home  Equipment: Cane - single point;Grab bars - tub/shower      Prior Function Prior Level of Function : Independent/Modified Independent;Driving             Mobility Comments: Community ambulator without AD, drives ADLs Comments: Independent     Hand Dominance        Extremity/Trunk Assessment   Upper Extremity Assessment Upper Extremity Assessment: Generalized weakness    Lower Extremity Assessment Lower Extremity Assessment: Generalized weakness    Cervical / Trunk Assessment Cervical / Trunk Assessment: Normal  Communication   Communication: No difficulties  Cognition Arousal/Alertness: Awake/alert Behavior During Therapy: WFL for tasks assessed/performed Overall Cognitive Status: Within Functional Limits for tasks assessed                                          General Comments      Exercises     Assessment/Plan    PT Assessment Patient needs continued PT services  PT Problem List Decreased strength;Decreased activity tolerance;Decreased balance;Decreased mobility       PT Treatment Interventions DME instruction;Gait training;Stair training;Functional mobility training;Therapeutic activities;Therapeutic exercise;Patient/family education;Balance training    PT Goals (Current goals can be found in the Care Plan section)  Acute Rehab PT Goals Patient Stated Goal: return home with family to assist PT Goal Formulation: With patient Time For Goal Achievement: 01/01/22 Potential to Achieve Goals: Good    Frequency Min 3X/week     Co-evaluation               AM-PAC PT "6 Clicks" Mobility  Outcome Measure Help needed turning from your back to your side while in a flat bed without using bedrails?: None Help needed moving from lying on your back to sitting on the side of a flat bed without using bedrails?: None Help needed moving to and from a bed to a chair (including a wheelchair)?: A Little Help needed standing up from a chair  using your arms (e.g., wheelchair or bedside chair)?: A Little Help needed to walk in hospital room?: A Little Help needed climbing 3-5 steps with a railing? : A Lot 6 Click Score: 19    End of Session   Activity Tolerance: Patient tolerated treatment well;Patient limited by fatigue Patient left: in chair;with call bell/phone within reach Nurse Communication: Mobility status PT Visit Diagnosis: Unsteadiness on feet (R26.81);Other abnormalities of gait and mobility (R26.89);Muscle weakness (generalized) (M62.81)    Time: 2841-3244 PT Time Calculation (min) (ACUTE ONLY): 21 min   Charges:   PT Evaluation $PT Eval Moderate Complexity: 1 Mod PT Treatments $Therapeutic Activity: 8-22 mins        3:45 PM, 12/25/21 Lonell Grandchild, MPT Physical Therapist with [facility] [phone] office [phone] mobile phone

## 2021-12-25 NOTE — Assessment & Plan Note (Signed)
-   Home meds on hold in setting of SBO

## 2021-12-25 NOTE — Plan of Care (Signed)
  Problem: Acute Rehab PT Goals(only PT should resolve) Goal: Pt Will Go Supine/Side To Sit Outcome: Progressing Flowsheets (Taken 12/25/2021 1546) Pt will go Supine/Side to Sit:  Independently  with modified independence Goal: Patient Will Transfer Sit To/From Stand Outcome: Progressing Flowsheets (Taken 12/25/2021 1546) Patient will transfer sit to/from stand:  Independently  with modified independence Goal: Pt Will Transfer Bed To Chair/Chair To Bed Outcome: Progressing Flowsheets (Taken 12/25/2021 1546) Pt will Transfer Bed to Chair/Chair to Bed: with modified independence Goal: Pt Will Ambulate Outcome: Progressing Flowsheets (Taken 12/25/2021 1546) Pt will Ambulate:  75 feet  with modified independence  with least restrictive assistive device   3:46 PM, 12/25/21 Lonell Grandchild, MPT Physical Therapist with [facility] [phone] office [phone] mobile phone

## 2021-12-25 NOTE — Progress Notes (Signed)
Pt had taken o2 spo2 81% on room air rr 36 hr 124 Albuterol MDI given breath sounds wheezing explained to patient the importance of him to keep his o2 on due to his low oxygen levels pt understands

## 2021-12-25 NOTE — Assessment & Plan Note (Addendum)
-   Differential includes questionable obstruction versus ileus.  No transition zone was noted on initial CT on admission.  Repeat abdominal imaging has shown persistently dilated small bowel loops in the upper and mid abdomen with paucity of colonic gas -NG tube not adequately decompressing and was repositioned on 12/20; unfortunately patient again dislodged and required replacing overnight once again - continue monitoring output from NGT - keep NPO - pain/nausea control - continue IVF -No significant improvement since admission.  Patient remains poor surgical candidate per surgery and this has been discussed with his daughter bedside who plans to also discuss further with her brother who is healthcare POA -Small bowel follow-through ordered 12/26/2021 per surgery -For now remains full code, but if continues to worsen, may need to further address code status as well - repeat lactic acid as well

## 2021-12-25 NOTE — Assessment & Plan Note (Signed)
-   Suspected from SBO - Continue supportive care and decompression

## 2021-12-25 NOTE — Hospital Course (Addendum)
Chad Clements is an 81 yo male with PMH HTN, HLD, CAD who presented with abdominal pain and N/V.  CT abdomen/pelvis was performed on admission which showed diffusely distended small bowel loops with decompressed colon concerning for ileus or obstruction.  He was admitted for further workup and evaluation.  He was followed by general surgery as well. Unfortunately he continued to decline throughout hospitalization with worsening signs of obstruction.  He was considered a poor surgical candidate especially with recovery.  Despite aggressive medical measures, he continued to decline with ensuing multi organ system failure as evidenced by worsening renal function and acute hypoxic respiratory failure. Goals of care discussions were held bedside with his son and daughter.  Patient was transitioned to comfort care and DNR status.  He had ongoing expected decline and passed naturally on 2022-01-25 at 1323.

## 2021-12-26 ENCOUNTER — Inpatient Hospital Stay (HOSPITAL_COMMUNITY): Payer: Medicare Other

## 2021-12-26 DIAGNOSIS — K56609 Unspecified intestinal obstruction, unspecified as to partial versus complete obstruction: Secondary | ICD-10-CM | POA: Diagnosis not present

## 2021-12-26 DIAGNOSIS — N179 Acute kidney failure, unspecified: Secondary | ICD-10-CM | POA: Diagnosis not present

## 2021-12-26 DIAGNOSIS — N1831 Chronic kidney disease, stage 3a: Secondary | ICD-10-CM | POA: Diagnosis not present

## 2021-12-26 LAB — CBC WITH DIFFERENTIAL/PLATELET
Abs Immature Granulocytes: 0.3 10*3/uL — ABNORMAL HIGH (ref 0.00–0.07)
Band Neutrophils: 11 %
Basophils Absolute: 0 10*3/uL (ref 0.0–0.1)
Basophils Relative: 0 %
Eosinophils Absolute: 0 10*3/uL (ref 0.0–0.5)
Eosinophils Relative: 0 %
HCT: 43.2 % (ref 39.0–52.0)
Hemoglobin: 13.7 g/dL (ref 13.0–17.0)
Lymphocytes Relative: 5 %
Lymphs Abs: 0.5 10*3/uL — ABNORMAL LOW (ref 0.7–4.0)
MCH: 28.7 pg (ref 26.0–34.0)
MCHC: 31.7 g/dL (ref 30.0–36.0)
MCV: 90.6 fL (ref 80.0–100.0)
Metamyelocytes Relative: 3 %
Monocytes Absolute: 1.2 10*3/uL — ABNORMAL HIGH (ref 0.1–1.0)
Monocytes Relative: 12 %
Neutro Abs: 8.1 10*3/uL — ABNORMAL HIGH (ref 1.7–7.7)
Neutrophils Relative %: 69 %
Platelets: 200 10*3/uL (ref 150–400)
RBC: 4.77 MIL/uL (ref 4.22–5.81)
RDW: 14.1 % (ref 11.5–15.5)
WBC: 10.1 10*3/uL (ref 4.0–10.5)
nRBC: 0 % (ref 0.0–0.2)

## 2021-12-26 LAB — HEPATIC FUNCTION PANEL
ALT: 307 U/L — ABNORMAL HIGH (ref 0–44)
AST: 419 U/L — ABNORMAL HIGH (ref 15–41)
Albumin: 2.9 g/dL — ABNORMAL LOW (ref 3.5–5.0)
Alkaline Phosphatase: 50 U/L (ref 38–126)
Bilirubin, Direct: 0.1 mg/dL (ref 0.0–0.2)
Indirect Bilirubin: 0.5 mg/dL (ref 0.3–0.9)
Total Bilirubin: 0.6 mg/dL (ref 0.3–1.2)
Total Protein: 6.1 g/dL — ABNORMAL LOW (ref 6.5–8.1)

## 2021-12-26 LAB — BLOOD GAS, VENOUS
Acid-Base Excess: 17.1 mmol/L — ABNORMAL HIGH (ref 0.0–2.0)
Bicarbonate: 42.6 mmol/L — ABNORMAL HIGH (ref 20.0–28.0)
Drawn by: 1528
O2 Saturation: 66.9 %
Patient temperature: 36.6
pCO2, Ven: 50 mmHg (ref 44–60)
pH, Ven: 7.54 — ABNORMAL HIGH (ref 7.25–7.43)
pO2, Ven: 39 mmHg (ref 32–45)

## 2021-12-26 LAB — TROPONIN I (HIGH SENSITIVITY): Troponin I (High Sensitivity): 79 ng/L — ABNORMAL HIGH (ref <18)

## 2021-12-26 LAB — LACTIC ACID, PLASMA: Lactic Acid, Venous: 2.2 mmol/L (ref 0.5–1.9)

## 2021-12-26 LAB — BASIC METABOLIC PANEL
Anion gap: 18 — ABNORMAL HIGH (ref 5–15)
BUN: 57 mg/dL — ABNORMAL HIGH (ref 8–23)
CO2: 32 mmol/L (ref 22–32)
Calcium: 9 mg/dL (ref 8.9–10.3)
Chloride: 96 mmol/L — ABNORMAL LOW (ref 98–111)
Creatinine, Ser: 3.36 mg/dL — ABNORMAL HIGH (ref 0.61–1.24)
GFR, Estimated: 18 mL/min — ABNORMAL LOW (ref >60)
Glucose, Bld: 109 mg/dL — ABNORMAL HIGH (ref 70–99)
Potassium: 4.2 mmol/L (ref 3.5–5.1)
Sodium: 146 mmol/L — ABNORMAL HIGH (ref 135–145)

## 2021-12-26 LAB — HEMOGLOBIN AND HEMATOCRIT, BLOOD
HCT: 43.8 % (ref 39.0–52.0)
Hemoglobin: 14.3 g/dL (ref 13.0–17.0)

## 2021-12-26 LAB — SODIUM, URINE, RANDOM: Sodium, Ur: 6 mmol/L

## 2021-12-26 LAB — CREATININE, URINE, RANDOM: Creatinine, Urine: 371.24 mg/dL

## 2021-12-26 LAB — MAGNESIUM: Magnesium: 2.5 mg/dL — ABNORMAL HIGH (ref 1.7–2.4)

## 2021-12-26 MED ORDER — DIATRIZOATE MEGLUMINE & SODIUM 66-10 % PO SOLN
90.0000 mL | Freq: Once | ORAL | Status: AC
  Administered 2021-12-26: 90 mL via NASOGASTRIC
  Filled 2021-12-26: qty 90

## 2021-12-26 MED ORDER — HALOPERIDOL LACTATE 5 MG/ML IJ SOLN
2.0000 mg | Freq: Once | INTRAMUSCULAR | Status: AC
  Administered 2021-12-26: 2 mg via INTRAVENOUS
  Filled 2021-12-26: qty 1

## 2021-12-26 MED ORDER — LACTATED RINGERS IV SOLN: INTRAVENOUS | Status: DC

## 2021-12-26 MED ORDER — IPRATROPIUM-ALBUTEROL 0.5-2.5 (3) MG/3ML IN SOLN
3.0000 mL | RESPIRATORY_TRACT | Status: DC | PRN
  Administered 2021-12-26: 3 mL via RESPIRATORY_TRACT

## 2021-12-26 MED ORDER — ENOXAPARIN SODIUM 30 MG/0.3ML IJ SOSY
30.0000 mg | PREFILLED_SYRINGE | INTRAMUSCULAR | Status: DC
  Administered 2021-12-26 – 2021-12-27 (×2): 30 mg via SUBCUTANEOUS
  Filled 2021-12-26: qty 0.3

## 2021-12-26 MED ORDER — DIATRIZOATE MEGLUMINE & SODIUM 66-10 % PO SOLN
ORAL | Status: AC
  Filled 2021-12-26: qty 90

## 2021-12-26 MED ORDER — MORPHINE SULFATE (PF) 2 MG/ML IV SOLN
2.0000 mg | Freq: Once | INTRAVENOUS | Status: AC
  Administered 2021-12-26: 2 mg via INTRAVENOUS
  Filled 2021-12-26: qty 1

## 2021-12-26 MED ORDER — IPRATROPIUM-ALBUTEROL 0.5-2.5 (3) MG/3ML IN SOLN
RESPIRATORY_TRACT | Status: AC
  Filled 2021-12-26: qty 3

## 2021-12-26 NOTE — Progress Notes (Signed)
Progress Note    Chad Clements   LFY:[id number]  DOB: October 29, 1940  DOA: 12/31/2021     2 PCP: Susy Frizzle, MD  Initial CC: abdominal pain  Hospital Course: Chad Clements is an 81 yo male with PMH HTN, HLD, CAD who presented with abdominal pain and N/V.  CT abdomen/pelvis was performed on admission which showed diffusely distended small bowel loops with decompressed colon concerning for ileus or obstruction.  There was no definitive transition point seen on imaging.  He was admitted for further workup and surgery evaluation.  Interval History:  Patient continued to have agitation and confusion overnight.  He pulled NG tube out once again and it required replacement. Daughter present bedside today and updated with Dr. Arnoldo Morale as well.  She understands guarded prognosis with potential for continued worsening and decline.  Patient remains a poor surgical candidate especially with recovery.  Renal function also worsening today. Small bowel follow-through ordered per surgery today too.   Assessment and Plan: * Small bowel obstruction (HCC) - Differential includes questionable obstruction versus ileus.  No transition zone was noted on initial CT on admission.  Repeat abdominal imaging has shown persistently dilated small bowel loops in the upper and mid abdomen with paucity of colonic gas -NG tube not adequately decompressing and was repositioned on 12/20; unfortunately patient again dislodged and required replacing overnight once again - continue monitoring output from NGT - keep NPO - pain/nausea control - continue IVF -No significant improvement since admission.  Patient remains poor surgical candidate per surgery and this has been discussed with his daughter bedside who plans to also discuss further with her brother who is healthcare POA -Small bowel follow-through ordered 12/26/2021 per surgery -For now remains full code, but if continues to worsen, may need to further address  code status as well - repeat lactic acid as well  Acute renal failure superimposed on stage 3a chronic kidney disease ([place]) - patient has history of CKD3A. Baseline creat ~ 1.3, eGFR 54 -Renal function now worsening since 12/25/2021 - Up to 3.36 this morning -Wide differential including obstruction versus volume depletion versus combo - Renal ultrasound repeated 12/26/2021 now showing left-sided hydronephrosis.  Not sure if this correlates with any of areas of bowel obstruction -Foley catheter placed morning of 12/26/2021.  Continue fluids and monitoring output. -Will repeat renal ultrasound in 2 to 3 days to reevaluate if necessary -Follow-up urine osmolality. FeNa 0% (very pre-renal). Last albumin 4.4 on 12/18 so 3rd spacing less likely, but will repeat albumin now as well - with Na slightly uptrending, will change IVF to LR and increase rate a little  Nausea and vomiting - Suspected from SBO - Continue supportive care and decompression  Hypertension - Home meds on hold in setting of SBO - Use labetalol or hydralazine PRN  Hyperlipidemia - Home meds on hold in setting of SBO   Old records reviewed in assessment of this patient  Antimicrobials:   DVT prophylaxis:  enoxaparin (LOVENOX) injection 30 mg Start: 12/26/21 1000 SCDs Start: 12/24/21 0249   Code Status:   Code Status: Full Code  Mobility Assessment (last 72 hours)     Mobility Assessment     Row Name 12/26/21 1100 12/25/21 1542 12/25/21 1000 12/24/21 2300 12/24/21 2037   Does patient have an order for bedrest or is patient medically unstable No - Continue assessment -- No - Continue assessment No - Continue assessment No - Continue assessment   What is the highest level of mobility  based on the progressive mobility assessment? Level 3 (Stands with assist) - Balance while standing  and cannot march in place Level 5 (Walks with assist in room/hall) - Balance while stepping forward/back and can walk in room with  assist - Complete Level 4 (Walks with assist in room) - Balance while marching in place and cannot step forward and back - Complete Level 4 (Walks with assist in room) - Balance while marching in place and cannot step forward and back - Complete Level 4 (Walks with assist in room) - Balance while marching in place and cannot step forward and back - Complete   Is the above level different from baseline mobility prior to current illness? Yes - Recommend PT order -- -- -- No - Consider discontinuing PT/OT            Barriers to discharge:  Disposition Plan:  Home 2-3 days Status is: Inpt  Objective: Blood pressure 101/70, pulse (!) 109, temperature 98.9 F (37.2 C), resp. rate 17, height 5' 10" (1.778 m), weight 60.8 kg, SpO2 99 %.  Examination:  Physical Exam Constitutional:      General: He is not in acute distress.    Comments: Uncomfortable appearing from abdominal distention and some nausea; appears worse today overall.  HENT:     Head: Normocephalic and atraumatic.     Mouth/Throat:     Mouth: Mucous membranes are moist.  Eyes:     Extraocular Movements: Extraocular movements intact.  Cardiovascular:     Rate and Rhythm: Normal rate and regular rhythm.  Pulmonary:     Effort: Pulmonary effort is normal. No respiratory distress.     Breath sounds: Normal breath sounds.  Abdominal:     Comments: Moderately distended with hypoactive bowel sounds.  Generalized tenderness to palpation throughout  Musculoskeletal:        General: Normal range of motion.     Cervical back: Normal range of motion and neck supple.  Skin:    General: Skin is warm and dry.  Neurological:     Mental Status: He is alert. He is disoriented.      Consultants:  General surgery  Procedures:    Data Reviewed: Results for orders placed or performed during the hospital encounter of 12/22/2021 (from the past 24 hour(s))  Blood gas, venous     Status: Abnormal   Collection Time: 12/26/21 12:53 AM   Result Value Ref Range   pH, Ven 7.54 (H) 7.25 - 7.43   pCO2, Ven 50 44 - 60 mmHg   pO2, Ven 39 32 - 45 mmHg   Bicarbonate 42.6 (H) 20.0 - 28.0 mmol/L   Acid-Base Excess 17.1 (H) 0.0 - 2.0 mmol/L   O2 Saturation 66.9 %   Patient temperature 36.6    Collection site BLOOD LEFT ARM    Drawn by 1528   Troponin I (High Sensitivity)     Status: Abnormal   Collection Time: 12/26/21 12:53 AM  Result Value Ref Range   Troponin I (High Sensitivity) 79 (H) <18 ng/L  Hemoglobin and hematocrit, blood     Status: None   Collection Time: 12/26/21 12:53 AM  Result Value Ref Range   Hemoglobin 14.3 13.0 - 17.0 g/dL   HCT 43.8 39.0 - 36.6 %  Basic metabolic panel     Status: Abnormal   Collection Time: 12/26/21  4:17 AM  Result Value Ref Range   Sodium 146 (H) 135 - 145 mmol/L   Potassium 4.2 3.5 - 5.1  mmol/L   Chloride 96 (L) 98 - 111 mmol/L   CO2 32 22 - 32 mmol/L   Glucose, Bld 109 (H) 70 - 99 mg/dL   BUN 57 (H) 8 - 23 mg/dL   Creatinine, Ser 3.36 (H) 0.61 - 1.24 mg/dL   Calcium 9.0 8.9 - 10.3 mg/dL   GFR, Estimated 18 (L) >60 mL/min   Anion gap 18 (H) 5 - 15  CBC with Differential/Platelet     Status: Abnormal   Collection Time: 12/26/21  4:17 AM  Result Value Ref Range   WBC 10.1 4.0 - 10.5 K/uL   RBC 4.77 4.22 - 5.81 MIL/uL   Hemoglobin 13.7 13.0 - 17.0 g/dL   HCT 43.2 39.0 - 52.0 %   MCV 90.6 80.0 - 100.0 fL   MCH 28.7 26.0 - 34.0 pg   MCHC 31.7 30.0 - 36.0 g/dL   RDW 14.1 11.5 - 15.5 %   Platelets 200 150 - 400 K/uL   nRBC 0.0 0.0 - 0.2 %   Neutrophils Relative % 69 %   Neutro Abs 8.1 (H) 1.7 - 7.7 K/uL   Band Neutrophils 11 %   Lymphocytes Relative 5 %   Lymphs Abs 0.5 (L) 0.7 - 4.0 K/uL   Monocytes Relative 12 %   Monocytes Absolute 1.2 (H) 0.1 - 1.0 K/uL   Eosinophils Relative 0 %   Eosinophils Absolute 0.0 0.0 - 0.5 K/uL   Basophils Relative 0 %   Basophils Absolute 0.0 0.0 - 0.1 K/uL   WBC Morphology MILD LEFT SHIFT (1-5% METAS, OCC MYELO, OCC BANDS)    RBC  Morphology MORPHOLOGY UNREMARKABLE    Metamyelocytes Relative 3 %   Abs Immature Granulocytes 0.30 (H) 0.00 - 0.07 K/uL  Magnesium     Status: Abnormal   Collection Time: 12/26/21  4:17 AM  Result Value Ref Range   Magnesium 2.5 (H) 1.7 - 2.4 mg/dL  Sodium, urine, random     Status: None   Collection Time: 12/26/21 11:30 AM  Result Value Ref Range   Sodium, Ur 6 mmol/L  Creatinine, urine, random     Status: None   Collection Time: 12/26/21 11:30 AM  Result Value Ref Range   Creatinine, Urine 371.24 mg/dL    I have Reviewed nursing notes, Vitals, and Lab results since pt's last encounter. Pertinent lab results : see above I have ordered labwork to follow up on.  I have reviewed the last note from staff over past 24 hours I have discussed pt's care plan and test results with nursing staff, CM/SW, and other staff as appropriate  Time spent: Greater than 50% of the 55 minute visit was spent in counseling/coordination of care for the patient as laid out in the A&P.   LOS: 2 days   Dwyane Dee, MD [facility] 12/26/2021, 4:46 PM

## 2021-12-26 NOTE — Progress Notes (Signed)
EKG done and given to nurse 

## 2021-12-26 NOTE — Progress Notes (Signed)
Writer reinserted Ng tube without issues patient tolerated well, RT at bedside for placement. Patient received prn haldol x1. Patient continues to pull at mittens and trying to take O2 off with writer and RT at bedside. Writer explained importance of NG tube and patient stated he understands and will not pull. However patient continues to attempt to pull at ng tube and O2 with mittens in place. Patient currently with tele sitter.

## 2021-12-26 NOTE — Progress Notes (Signed)
   12/26/21 1315  Assess: MEWS Score  Pulse Rate (!) 102  Resp 18  SpO2 99 %  Assess: MEWS Score  MEWS Temp 0  MEWS Systolic 1  MEWS Pulse 1  MEWS RR 0  MEWS LOC 0  MEWS Score 2  MEWS Score Color Yellow  Assess: if the MEWS score is Yellow or Red  Were vital signs taken at a resting state? Yes  Focused Assessment Change from prior assessment (see assessment flowsheet)  Does the patient meet 2 or more of the SIRS criteria? Yes  Does the patient have a confirmed or suspected source of infection? No  MEWS guidelines implemented *See Row Information* Yes  Treat  MEWS Interventions Other (Comment)  Take Vital Signs  Increase Vital Sign Frequency  Yellow: Q 2hr X 2 then Q 4hr X 2, if remains yellow, continue Q 4hrs  Escalate  MEWS: Escalate Yellow: discuss with charge nurse/RN and consider discussing with provider and RRT  Notify: Charge Nurse/RN  Name of Charge Nurse/RN Notified [place], RN  Date Charge Nurse/RN Notified 12/26/21  Time Charge Nurse/RN Notified 1320  Provider Notification  Provider Name/Title Dr. Sabino Gasser  Date Provider Notified 12/26/21  Time Provider Notified 1320  Method of Notification Page  Notification Reason Other (Comment) (HR and RR)  Provider response No new orders  Date of Provider Response 12/26/21  Time of Provider Response 1350  Assess: SIRS CRITERIA  SIRS Temperature  0  SIRS Pulse 1  SIRS Respirations  0  SIRS WBC 0  SIRS Score Sum  1

## 2021-12-26 NOTE — Progress Notes (Signed)
Subjective: Events of last night noted.  Patient appears to be a little bit more confused.  Objective: Vital signs in last 24 hours: Temp:  [97.9 F (36.6 C)-98.5 F (36.9 C)] 98.2 F (36.8 C) (12/21 0500) Pulse Rate:  [74-90] 74 (12/21 0500) Resp:  [17-19] 17 (12/21 0500) BP: (111-132)/(62-89) 111/62 (12/21 0500) SpO2:  [81 %-99 %] 90 % (12/21 0817) Weight:  [60.8 kg] 60.8 kg (12/21 0500) Last BM Date : 12/19/21  Intake/Output from previous day: 12/20 0701 - 12/21 0700 In: 822.5 [I.V.:822.5] Out: 1500 [Urine:500; Emesis/NG output:1000] Intake/Output this shift: No intake/output data recorded.  General appearance: no distress GI: Soft but distended.  No tenderness noted.  Lab Results:  Recent Labs    12/25/21 0442 12/26/21 0053 12/26/21 0417  WBC 8.2  --  10.1  HGB 13.4 14.3 13.7  HCT 41.3 43.8 43.2  PLT 213  --  200   BMET Recent Labs    12/25/21 0442 12/26/21 0417  NA 141 146*  K 5.1 4.2  CL 97* 96*  CO2 32 32  GLUCOSE 154* 109*  BUN 42* 57*  CREATININE 1.47* 3.36*  CALCIUM 9.2 9.0   PT/INR No results for input(s): "LABPROT", "INR" in the last 72 hours.  Studies/Results: DG CHEST PORT 1 VIEW  Result Date: 12/26/2021 CLINICAL DATA:  Confirm nasogastric tube placement EXAM: PORTABLE CHEST 1 VIEW COMPARISON:  Earlier today FINDINGS: Repositioned enteric tube with tip and side port at the stomach. Dilated gas-filled bowel in the upper abdomen as characterized by CT. No infiltrate, effusion, or pneumothorax. Normal heart size. IMPRESSION: Repositioned enteric tube with tip and side port now at the stomach. Electronically Signed   By: Jorje Guild M.D.   On: 12/26/2021 05:27   DG CHEST PORT 1 VIEW  Result Date: 12/26/2021 CLINICAL DATA:  Hypoxia, NG tube. EXAM: PORTABLE CHEST 1 VIEW COMPARISON:  Chest x-ray 12/25/2021 FINDINGS: Nasogastric tube is now folded upon itself in the mid esophagus with distal tip at the level of the upper esophagus  projecting cranially. There are dilated loops of bowel in the upper abdomen, unchanged. The lungs are grossly clear. The cardiomediastinal silhouette is within normal limits. IMPRESSION: Nasogastric tube is now folded upon itself in the mid esophagus with distal tip at the level of the upper esophagus projecting cranially. Recommend repositioning. Electronically Signed   By: Ronney Asters M.D.   On: 12/26/2021 00:54   DG Chest Port 1 View  Result Date: 12/25/2021 CLINICAL DATA:  Enteric tube placement. EXAM: PORTABLE CHEST 1 VIEW COMPARISON:  Abdominal x-ray from yesterday. CT chest dated December 24, 2021. FINDINGS: Unchanged enteric tube in the stomach, confirmed by contrast injection. Multiple dilated loops of small bowel in the upper abdomen again noted, not significantly changed. The visualized lungs are clear. Normal heart size. No acute osseous abnormality. IMPRESSION: 1. Unchanged enteric tube in the stomach. 2. Unchanged small bowel obstruction. Electronically Signed   By: Titus Dubin M.D.   On: 12/25/2021 13:09   DG Abd Portable 1V-Small Bowel Protocol-Position Verification  Result Date: 12/24/2021 CLINICAL DATA:  Nasogastric tube placement, KUB for small bowel obstruction protocol EXAM: PORTABLE ABDOMEN - 1 VIEW COMPARISON:  Portable exam 1112 hours compared to CT exam of 12/12/2021 FINDINGS: Nasogastric tube within stomach at fundus, stomach decompressed. Numerous dilated small bowel loops in the upper and mid abdomen. Paucity of colonic gas. No bowel wall thickening. Lung bases clear. Coronary stent noted. Excreted contrast material within renal collecting systems, proximal ureters, and  bladder. No osseous findings. IMPRESSION: Small-bowel obstruction. Electronically Signed   By: Lavonia Dana M.D.   On: 12/24/2021 11:25   US RENAL  Result Date: 12/24/2021 CLINICAL DATA:  Renal lesion EXAM: RENAL / URINARY TRACT ULTRASOUND COMPLETE COMPARISON:  None Available. FINDINGS: Right Kidney:  Length: 9.9 cm. Echogenicity within normal limits. Upper pole cyst measures 4.2 cm. No stones or Hydronephrosis. Left Kidney: Length: 8.8 cm. Echogenicity within normal limits. Extrarenal pelvis. No renal parenchymal lesions or hydronephrosis. There is evidence of possible pleural effusions. Fluid-filled bowel loops identified as an incidental finding in the pelvis and upper abdomen. IMPRESSION: 1. No hydronephrosis or shadowing stones identified. 2. Right kidney cyst. 3. Extrarenal pelvis on the left. 4. Possible bilateral pleural effusions. 5. Fluid-filled dilated bowel. Electronically Signed   By: Sammie Bench M.D.   On: 12/24/2021 10:38    Anti-infectives: Anti-infectives (From admission, onward)    None       Assessment/Plan: Impression: Overall, the patient is condition has worsened.  He is more distended and his renal function has worsened.  Will get a small bowel protocol study to see whether his bowel obstruction is mechanical in nature.  I discussed with Dr. Thornton Papas of radiology who states oral Gastrografin is safe to give in renal insufficiency patients.  Further management is pending those results.  Patient is a poor surgical candidate.  LOS: 2 days    Aviva Signs 12/26/2021

## 2021-12-27 DIAGNOSIS — J9601 Acute respiratory failure with hypoxia: Secondary | ICD-10-CM

## 2021-12-27 DIAGNOSIS — K56609 Unspecified intestinal obstruction, unspecified as to partial versus complete obstruction: Secondary | ICD-10-CM | POA: Diagnosis not present

## 2021-12-27 DIAGNOSIS — N1831 Chronic kidney disease, stage 3a: Secondary | ICD-10-CM | POA: Diagnosis not present

## 2021-12-27 DIAGNOSIS — N179 Acute kidney failure, unspecified: Secondary | ICD-10-CM | POA: Diagnosis not present

## 2021-12-27 LAB — CBC WITH DIFFERENTIAL/PLATELET
Abs Immature Granulocytes: 1 10*3/uL — ABNORMAL HIGH (ref 0.00–0.07)
Band Neutrophils: 14 %
Basophils Absolute: 0 10*3/uL (ref 0.0–0.1)
Basophils Relative: 0 %
Eosinophils Absolute: 0 10*3/uL (ref 0.0–0.5)
Eosinophils Relative: 0 %
HCT: 39.6 % (ref 39.0–52.0)
Hemoglobin: 12.5 g/dL — ABNORMAL LOW (ref 13.0–17.0)
Lymphocytes Relative: 7 %
Lymphs Abs: 1 10*3/uL (ref 0.7–4.0)
MCH: 29.2 pg (ref 26.0–34.0)
MCHC: 31.6 g/dL (ref 30.0–36.0)
MCV: 92.5 fL (ref 80.0–100.0)
Metamyelocytes Relative: 4 %
Monocytes Absolute: 2.6 10*3/uL — ABNORMAL HIGH (ref 0.1–1.0)
Monocytes Relative: 19 %
Myelocytes: 3 %
Neutro Abs: 9.2 10*3/uL — ABNORMAL HIGH (ref 1.7–7.7)
Neutrophils Relative %: 53 %
Platelets: 176 10*3/uL (ref 150–400)
RBC: 4.28 MIL/uL (ref 4.22–5.81)
RDW: 14.4 % (ref 11.5–15.5)
WBC: 13.7 10*3/uL — ABNORMAL HIGH (ref 4.0–10.5)
nRBC: 0.1 % (ref 0.0–0.2)

## 2021-12-27 LAB — COMPREHENSIVE METABOLIC PANEL
ALT: 273 U/L — ABNORMAL HIGH (ref 0–44)
AST: 263 U/L — ABNORMAL HIGH (ref 15–41)
Albumin: 2.7 g/dL — ABNORMAL LOW (ref 3.5–5.0)
Alkaline Phosphatase: 50 U/L (ref 38–126)
Anion gap: 18 — ABNORMAL HIGH (ref 5–15)
BUN: 84 mg/dL — ABNORMAL HIGH (ref 8–23)
CO2: 34 mmol/L — ABNORMAL HIGH (ref 22–32)
Calcium: 8.6 mg/dL — ABNORMAL LOW (ref 8.9–10.3)
Chloride: 96 mmol/L — ABNORMAL LOW (ref 98–111)
Creatinine, Ser: 6.47 mg/dL — ABNORMAL HIGH (ref 0.61–1.24)
GFR, Estimated: 8 mL/min — ABNORMAL LOW (ref >60)
Glucose, Bld: 85 mg/dL (ref 70–99)
Potassium: 4.6 mmol/L (ref 3.5–5.1)
Sodium: 148 mmol/L — ABNORMAL HIGH (ref 135–145)
Total Bilirubin: 0.4 mg/dL (ref 0.3–1.2)
Total Protein: 6.1 g/dL — ABNORMAL LOW (ref 6.5–8.1)

## 2021-12-27 LAB — OSMOLALITY, URINE: Osmolality, Ur: 512 mOsm/kg (ref 300–900)

## 2021-12-27 LAB — LACTIC ACID, PLASMA: Lactic Acid, Venous: 2 mmol/L (ref 0.5–1.9)

## 2021-12-27 LAB — MAGNESIUM: Magnesium: 2.7 mg/dL — ABNORMAL HIGH (ref 1.7–2.4)

## 2021-12-27 MED ORDER — GLYCOPYRROLATE 0.2 MG/ML IJ SOLN: 0.2000 mg | INTRAMUSCULAR | Status: DC | PRN

## 2021-12-27 MED ORDER — MORPHINE SULFATE (PF) 2 MG/ML IV SOLN
2.0000 mg | INTRAVENOUS | Status: DC | PRN
  Administered 2021-12-27: 2 mg via INTRAVENOUS
  Filled 2021-12-27: qty 1

## 2021-12-27 MED ORDER — LORAZEPAM 1 MG PO TABS: 1.0000 mg | ORAL_TABLET | ORAL | Status: DC | PRN

## 2021-12-27 MED ORDER — HALOPERIDOL 0.5 MG PO TABS: 0.5000 mg | ORAL_TABLET | ORAL | Status: DC | PRN

## 2021-12-27 MED ORDER — HALOPERIDOL LACTATE 2 MG/ML PO CONC: 0.5000 mg | ORAL | Status: DC | PRN

## 2021-12-27 MED ORDER — HALOPERIDOL LACTATE 5 MG/ML IJ SOLN: 0.5000 mg | INTRAMUSCULAR | Status: DC | PRN

## 2021-12-27 MED ORDER — ONDANSETRON HCL 4 MG/2ML IJ SOLN: 4.0000 mg | Freq: Four times a day (QID) | INTRAMUSCULAR | Status: DC | PRN

## 2021-12-27 MED ORDER — POLYVINYL ALCOHOL 1.4 % OP SOLN: 1.0000 [drp] | Freq: Four times a day (QID) | OPHTHALMIC | Status: DC | PRN

## 2021-12-27 MED ORDER — CHLORHEXIDINE GLUCONATE CLOTH 2 % EX PADS
6.0000 | MEDICATED_PAD | Freq: Every day | CUTANEOUS | Status: DC
  Administered 2021-12-27: 6 via TOPICAL

## 2021-12-27 MED ORDER — ONDANSETRON 4 MG PO TBDP: 4.0000 mg | ORAL_TABLET | Freq: Four times a day (QID) | ORAL | Status: DC | PRN

## 2021-12-27 MED ORDER — LORAZEPAM 2 MG/ML IJ SOLN: 1.0000 mg | INTRAMUSCULAR | Status: DC | PRN

## 2021-12-27 MED ORDER — GLYCOPYRROLATE 1 MG PO TABS: 1.0000 mg | ORAL_TABLET | ORAL | Status: DC | PRN

## 2022-01-06 NOTE — Progress Notes (Signed)
Notified Management consultant for referral per policy after pronounced by two RNs and provider notified. Spoke with Saralyn Pilar and referall # [phone] provided.

## 2022-01-06 NOTE — TOC Transition Note (Signed)
Transition of Care [facility]) - CM/SW Discharge Note   Patient Details  Name: Chad Clements MRN: [id number] Date of Birth: 1940-07-29  Transition of Care [facility]) CM/SW Contact:  Iona Beard, [place] Phone Number: 01-02-2022, 1:34 PM   Clinical Narrative:    CSW updated of pts passing. CSW updated Tommi Rumps with Alvis Lemmings as [facility] services had been accept for their company. TOC signing off.   Final next level of care: Expired Barriers to Discharge: Continued Medical Work up   Patient Goals and CMS Choice   Choice offered to / list presented to : Patient  Discharge Placement                         Discharge Plan and Services Additional resources added to the After Visit Summary for   In-house Referral: Clinical Social Work   Post Acute Care Choice: Home Health                    HH Arranged: PT [facility] Agency: [place] Date [place]: 12/25/21 Time [place]: [id number] Representative spoke with at [place]: [place] Determinants of Health ([place]) Interventions SDOH Screenings   Food Insecurity: No Food Insecurity (12/24/2021)  Housing: Low Risk  (12/24/2021)  Transportation Needs: No Transportation Needs (12/24/2021)  Utilities: Not At Risk (12/24/2021)  Alcohol Screen: Low Risk  (09/26/2021)  Depression (PHQ2-9): Low Risk  (09/26/2021)  Financial Resource Strain: Low Risk  (09/26/2021)  Physical Activity: Insufficiently Active (09/26/2021)  Social Connections: Moderately Integrated (09/26/2021)  Stress: No Stress Concern Present (09/26/2021)  Tobacco Use: High Risk (12/29/2021)     Readmission Risk Interventions     No data to display

## 2022-01-06 NOTE — Care Management Important Message (Signed)
Important Message  Patient Details  Name: Chad Clements MRN: [id number] Date of Birth: 1940/12/12   Medicare Important Message Given:  Yes     Tommy Medal 01-07-22, 9:37 AM

## 2022-01-06 NOTE — Discharge Summary (Signed)
Death Summary  Chad Clements HUT:[id number] DOB: May 18, 1940 DOA: January 04, 2022  PCP: Susy Frizzle, MD  Admit date: January 04, 2022 Date of Death: 2022-01-08 Time of Death: Mar 27, 1321 Notification: Family notified of death   History of present illness:  Chad Clements is an 82 yo male with PMH HTN, HLD, CAD who presented with abdominal pain and N/V.  CT abdomen/pelvis was performed on admission which showed diffusely distended small bowel loops with decompressed colon concerning for ileus or obstruction.  He was admitted for further workup and evaluation.  He was followed by general surgery as well. Unfortunately he continued to decline throughout hospitalization with worsening signs of obstruction.  He was considered a poor surgical candidate especially with recovery.  Despite aggressive medical measures, he continued to decline with ensuing multi organ system failure as evidenced by worsening renal function and acute hypoxic respiratory failure. Goals of care discussions were held bedside with his son and daughter.  Patient was transitioned to comfort care and DNR status.  He had ongoing expected decline and passed naturally on 01-08-22 at 1323.  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    Significant Diagnostic Studies: DG Abd Portable 1V-Small Bowel Obstruction Protocol-initial, 8 hr delay  Result Date: 12/26/2021 CLINICAL DATA:  Small bowel obstruction, 8 hour delay EXAM: PORTABLE ABDOMEN - 1 VIEW COMPARISON:  Abdominal radiograph dated 12/24/2021. CT abdomen/pelvis dated January 04, 2022. FINDINGS: Multiple dilated loops of small bowel in the central abdomen. Contrast opacifies the stomach with associated enteric tube. Additional contrast within a dilated loop of central small bowel. No  contrast in the colon. IMPRESSION: Contrast in the stomach and a dilated loop of small bowel in the central abdomen. No contrast in the colon. Electronically Signed   By: Julian Hy M.D.   On: 12/26/2021 19:12   US RENAL  Result Date: 12/26/2021 CLINICAL DATA:  Acute kidney injury EXAM: RENAL / URINARY TRACT ULTRASOUND COMPLETE COMPARISON:  Ultrasound 12/24/2021 FINDINGS: Right Kidney: Renal measurements: 10.3 x 4.6 x 5.3 cm = volume: 131.09 mL. No hydronephrosis. Increased renal cortical echogenicity. There is a 4.1 cm simple cyst in the upper pole. Left Kidney: Renal measurements: 10.3 x 4.9 x 5.9 cm = volume: 155.72 mL. Mild hydronephrosis. There is a 1.5 cm exophytic simple cyst along the lower pole. Increased renal cortical echogenicity. Bladder: Bladder is decompressed with Foley catheter in place. Other: Right pleural effusion. Multiple dilated loops of small bowel noted, as seen on recent CT. IMPRESSION: Mild left-sided hydronephrosis. Increased renal cortical echogenicity bilaterally, as can be seen in medical renal disease. Decompressed bladder with Foley catheter in place. Multiple dilated loops of small bowel noted, as seen on recent CT. Right pleural effusion. Electronically Signed   By: Maurine Simmering M.D.   On: 12/26/2021 11:41   DG CHEST PORT 1 VIEW  Result Date: 12/26/2021 CLINICAL DATA:  Confirm nasogastric tube placement EXAM: PORTABLE CHEST 1 VIEW COMPARISON:  Earlier today FINDINGS: Repositioned enteric tube with tip and side port at the stomach. Dilated gas-filled bowel in the upper abdomen as characterized by CT. No infiltrate, effusion, or pneumothorax. Normal heart size. IMPRESSION: Repositioned enteric tube with tip and side port now at the stomach. Electronically Signed   By: Jorje Guild M.D.   On: 12/26/2021 05:27   DG CHEST PORT 1 VIEW  Result Date: 12/26/2021 CLINICAL DATA:  Hypoxia, NG tube. EXAM: PORTABLE CHEST 1 VIEW COMPARISON:  Chest x-ray 12/25/2021  FINDINGS: Nasogastric tube is now folded upon itself in the mid esophagus with distal tip at the level of the upper esophagus projecting cranially. There are dilated loops of bowel in the upper abdomen, unchanged. The lungs are grossly clear. The cardiomediastinal silhouette is within normal limits. IMPRESSION: Nasogastric tube is now folded upon itself in the mid esophagus with distal tip at the level of the upper esophagus projecting cranially. Recommend repositioning. Electronically Signed   By: Ronney Asters M.D.   On: 12/26/2021 00:54   DG Chest Port 1 View  Result Date: 12/25/2021 CLINICAL DATA:  Enteric tube placement. EXAM: PORTABLE CHEST 1 VIEW COMPARISON:  Abdominal x-ray from yesterday. CT chest dated December 24, 2021. FINDINGS: Unchanged enteric tube in the stomach, confirmed by contrast injection. Multiple dilated loops of small bowel in the upper abdomen again noted, not significantly changed. The visualized lungs are clear. Normal heart size. No acute osseous abnormality. IMPRESSION: 1. Unchanged enteric tube in the stomach. 2. Unchanged small bowel obstruction. Electronically Signed   By: Titus Dubin M.D.   On: 12/25/2021 13:09   DG Abd Portable 1V-Small Bowel Protocol-Position Verification  Result Date: 12/24/2021 CLINICAL DATA:  Nasogastric tube placement, KUB for small bowel obstruction protocol EXAM: PORTABLE ABDOMEN - 1 VIEW COMPARISON:  Portable exam 1112 hours compared to CT exam of 12/21/2021 FINDINGS: Nasogastric tube within stomach at fundus, stomach decompressed. Numerous dilated small bowel loops in the upper and mid abdomen. Paucity of colonic gas. No bowel wall thickening. Lung bases clear. Coronary stent noted. Excreted contrast material within renal collecting systems, proximal ureters, and bladder. No osseous findings. IMPRESSION: Small-bowel obstruction. Electronically Signed   By: Lavonia Dana M.D.   On: 12/24/2021 11:25   US RENAL  Result Date:  12/24/2021 CLINICAL DATA:  Renal lesion EXAM: RENAL / URINARY TRACT ULTRASOUND COMPLETE COMPARISON:  None Available. FINDINGS: Right Kidney: Length: 9.9 cm. Echogenicity within normal limits. Upper pole cyst measures 4.2 cm. No stones or Hydronephrosis. Left Kidney: Length: 8.8 cm. Echogenicity within normal limits. Extrarenal pelvis. No renal parenchymal lesions or hydronephrosis. There is evidence of possible pleural effusions. Fluid-filled bowel loops identified as an incidental finding in the pelvis and upper abdomen. IMPRESSION: 1. No hydronephrosis or shadowing stones identified. 2. Right kidney cyst. 3. Extrarenal pelvis on the left. 4. Possible bilateral pleural effusions. 5. Fluid-filled dilated bowel. Electronically Signed   By: Sammie Bench M.D.   On: 12/24/2021 10:38   CT CHEST WO CONTRAST  Result Date: 12/24/2021 CLINICAL DATA:  Follow-up pulmonary nodule on prior CT EXAM: CT CHEST WITHOUT CONTRAST TECHNIQUE: Multidetector CT imaging of the chest was performed following the standard protocol without IV contrast. RADIATION DOSE REDUCTION: This exam was performed according to the departmental dose-optimization program which includes automated exposure control, adjustment of the mA and/or kV according to patient size and/or use of iterative reconstruction technique. COMPARISON:  CT chest dated 12/20/2020. FINDINGS: Cardiovascular: Heart is normal size.  No pericardial effusion. No evidence of thoracic aortic aneurysm. Atherosclerotic calcifications of the arch. Three vessel coronary atherosclerosis. Mediastinum/Nodes: No suspicious mediastinal lymphadenopathy. Visualized thyroid is unremarkable. Lungs/Pleura: Mild centrilobular and paraseptal emphysematous changes, upper lung predominant. Bronchiectasis bronchial wall thickening in the bilateral lower lobes. Scattered areas of chronic mucous plugging in the left lower lobe. No focal consolidation. 3 mm subpleural left lower lobe pulmonary nodule  (series 4/image 112), grossly unchanged, benign. No follow-up is recommended per Fleischner  Society guidelines. No new/suspicious pulmonary nodules. No pleural effusion or pneumothorax. Upper Abdomen: Visualized upper abdomen is unchanged from CT abdomen/pelvis dated 12/18/2021. Musculoskeletal: Visualized osseous structures are within normal limits. IMPRESSION: No evidence of acute cardiopulmonary disease. 3 mm subpleural left lower lobe pulmonary nodule, grossly unchanged from 2022, benign. No follow-up imaging is recommended per Fleischner Society guidelines. Aortic Atherosclerosis (ICD10-I70.0) and Emphysema (ICD10-J43.9). Electronically Signed   By: Julian Hy M.D.   On: 12/24/2021 03:39   DG Chest Portable 1 View  Result Date: 12/22/2021 CLINICAL DATA:  NG tube placement EXAM: PORTABLE CHEST 1 VIEW COMPARISON:  12/24/2021 FINDINGS: Lung fields are clear. Esophageal tube tip remains folded back upon itself with the tip directed cranial over the distal esophageal region. IMPRESSION: Esophageal tube tip remains folded back upon itself with the tip directed cranial over the distal esophageal region. Electronically Signed   By: Donavan Foil M.D.   On: 12/19/2021 23:43   DG Chest Portable 1 View  Result Date: 12/28/2021 CLINICAL DATA:  NG tube placement EXAM: PORTABLE CHEST 1 VIEW COMPARISON:  11/09/2021, CT 12/24/2021 FINDINGS: Esophageal tube is folded back upon itself with the tip directed cranial over the distal esophagus. No acute airspace disease. Normal cardiac size. No pneumothorax. Dilated loops of bowel in the upper abdomen. IMPRESSION: Esophageal tube is folded back upon itself with the tip directed cranial over the distal esophagus. Electronically Signed   By: Donavan Foil M.D.   On: 01/02/2022 23:24   CT Abdomen Pelvis W Contrast  Result Date: 01/03/2022 CLINICAL DATA:  Abdominal pain, acute, nonlocalized. EXAM: CT ABDOMEN AND PELVIS WITH CONTRAST TECHNIQUE: Multidetector CT  imaging of the abdomen and pelvis was performed using the standard protocol following bolus administration of intravenous contrast. RADIATION DOSE REDUCTION: This exam was performed according to the departmental dose-optimization program which includes automated exposure control, adjustment of the mA and/or kV according to patient size and/or use of iterative reconstruction technique. CONTRAST:  58mL OMNIPAQUE IOHEXOL 300 MG/ML  SOLN COMPARISON:  None Available. FINDINGS: Lower chest: Coronary artery calcifications. Emphysematous changes are present at the lung bases. Hepatobiliary: A few scattered hypodensities are noted in the left lobe of the liver, measuring up to 1.2 cm, statistically most likely representing cysts or hemangiomas. No biliary ductal dilatation. Gallbladder is without stones. Pancreas: Unremarkable. No pancreatic ductal dilatation or surrounding inflammatory changes. Spleen: The spleen is normal in size and scattered hypodensities are noted, possible cysts or hemangiomas. Adrenals/Urinary Tract: The adrenal glands are within normal limits. Cysts are present in the kidneys bilaterally. There is a complex hypodensity in the lower pole of the left kidney measuring 1.4 cm. No renal calculus or hydronephrosis. The bladder is unremarkable. Stomach/Bowel: The stomach is within normal limits. A small bowel is diffusely distended measuring up to 4 cm in diameter. No pneumatosis or free air. The colon is decompressed and the appendix is not visualized on exam. No focal bowel wall thickening. There is mild swirling of the mesenteric root, best seen on axial image 39. Vascular/Lymphatic: Aortic atherosclerosis. No enlarged abdominal or pelvic lymph nodes. Reproductive: Prostate gland is enlarged. Other: No intra-abdominal free fluid Musculoskeletal: Degenerative changes in the thoracolumbar spine. No acute osseous abnormality. IMPRESSION: 1. Diffusely distended small bowel loops with decompressed colon,  possible ileus or obstruction. No definite transition point is seen. There is questionable swirling at the mesenteric root. Surgical consultation is recommended. 2. Complex hypodensity in the lower pole of the left kidney. Ultrasound is recommended for further characterization. 3.  Enlarged prostate gland. 4. Emphysema. 5. Aortic atherosclerosis and coronary artery calcifications. Critical Value/emergent results were called by telephone at the time of interpretation on 12/17/2021 at 10:07 pm to provider [facility] , who verbally acknowledged these results. Electronically Signed   By: Brett Fairy M.D.   On: 12/26/2021 22:07    Microbiology: No results found for this or any previous visit (from the past 240 hour(s)).   Labs: Basic Metabolic Panel: Recent Labs  Lab 12/08/2021 1751 12/24/21 0431 12/25/21 0442 12/26/21 0417 2022/01/11 0421  NA 141 134* 141 146* 148*  K 4.6 4.4 5.1 4.2 4.6  CL 101 106 97* 96* 96*  CO2 26 14* 32 32 34*  GLUCOSE 137* 66* 154* 109* 85  BUN 33* 22 42* 57* 84*  CREATININE 1.62* 0.83 1.47* 3.36* 6.47*  CALCIUM 10.3 7.0* 9.2 9.0 8.6*  MG  --  2.2  --  2.5* 2.7*   Liver Function Tests: Recent Labs  Lab 12/22/2021 1751 12/26/21 1846 2022/01/11 0421  AST 28 419* 263*  ALT 26 307* 273*  ALKPHOS 65 50 50  BILITOT 0.6 0.6 0.4  PROT 7.8 6.1* 6.1*  ALBUMIN 4.4 2.9* 2.7*   Recent Labs  Lab 12/06/2021 1751  LIPASE 31   No results for input(s): "AMMONIA" in the last 168 hours. CBC: Recent Labs  Lab 12/07/2021 1751 12/24/21 0431 12/25/21 0442 12/26/21 0053 12/26/21 0417 01-11-22 0421  WBC 9.4 7.1 8.2  --  10.1 13.7*  NEUTROABS  --   --   --   --  8.1* 9.2*  HGB 14.4 9.6* 13.4 14.3 13.7 12.5*  HCT 44.5 30.1* 41.3 43.8 43.2 39.6  MCV 90.4 92.9 90.4  --  90.6 92.5  PLT 232 139* 213  --  200 176   Cardiac Enzymes: No results for input(s): "CKTOTAL", "CKMB", "CKMBINDEX", "TROPONINI" in the last 168 hours. D-Dimer No results for input(s): "DDIMER" in the last  72 hours. BNP: Invalid input(s): "POCBNP" CBG: No results for input(s): "GLUCAP" in the last 168 hours. Anemia work up No results for input(s): "VITAMINB12", "FOLATE", "FERRITIN", "TIBC", "IRON", "RETICCTPCT" in the last 72 hours. Urinalysis    Component Value Date/Time   COLORURINE YELLOW 12/29/2021 1725   APPEARANCEUR CLEAR 12/14/2021 1725   LABSPEC 1.021 12/08/2021 1725   PHURINE 5.0 12/12/2021 1725   GLUCOSEU 50 (A) 12/26/2021 1725   HGBUR NEGATIVE 12/09/2021 1725   BILIRUBINUR NEGATIVE 12/22/2021 1725   KETONESUR 5 (A) 12/12/2021 1725   PROTEINUR 100 (A) 12/20/2021 1725   NITRITE NEGATIVE 12/22/2021 1725   LEUKOCYTESUR NEGATIVE 12/25/2021 1725   Sepsis Labs Recent Labs  Lab 12/24/21 0431 12/25/21 0442 12/26/21 0417 Jan 11, 2022 0421  WBC 7.1 8.2 10.1 13.7*       SIGNED:  Dwyane Dee, MD  [facility] 01-11-22, 3:08 PM

## 2022-01-06 NOTE — Progress Notes (Signed)
Patient's overall condition has worsened.  After discussion with the family, he has been made DNR, comfort measures only.  They understand surgical intervention would not help him at the present time and it would be against his wishes to be placed on a ventilator. Will sign off.

## 2022-01-06 NOTE — Progress Notes (Signed)
Called to patient's room. Listened for heart sounds, no heart sounds present. Time of death 78. Dr. Sabino Gasser notified, Candace primary nurse at bedside.

## 2022-01-06 DEATH — deceased

## 2023-10-16 IMAGING — CT CT CHEST W/ CM
2 of 3 series · 15 of 36 positions shown, 18 images · IV contrast (Omnipaque or Isovue)
Comparison: Radiograph dated December 20, 2020

CLINICAL DATA: Lung nodule

EXAM:
CT CHEST WITH CONTRAST
TECHNIQUE: Multidetector CT imaging of the chest was performed during
intravenous contrast administration.
CONTRAST:  75mL OMNIPAQUE IOHEXOL 300 MG/ML  SOLN

[Series 2: routine chest with · axial · 0.70mm/px · z∈[-372,-48]mm · 12 of 192 slices shown, 15 images]
[im 15/192  mediastinal]
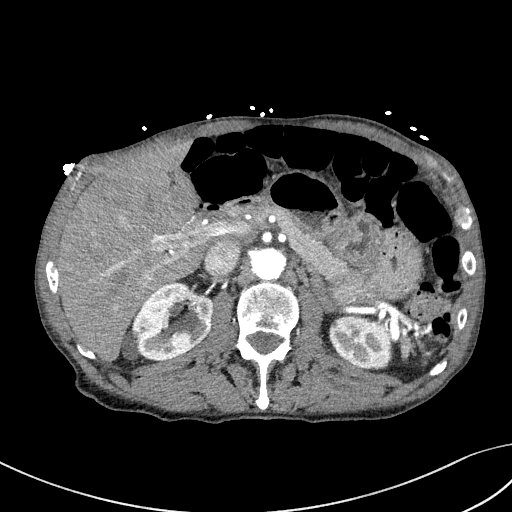
[im 15/192  lung]
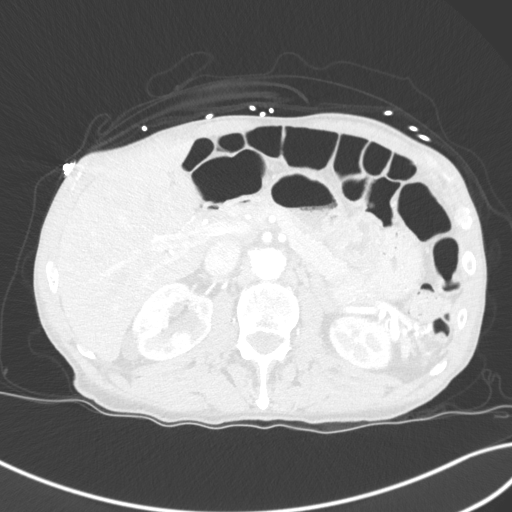
[im 29/192  lung]
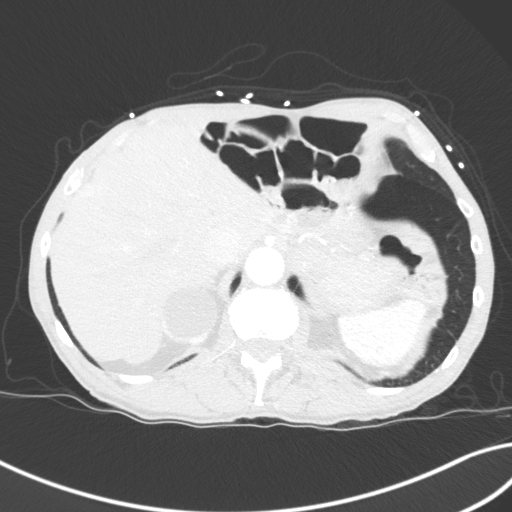
[im 43/192  lung]
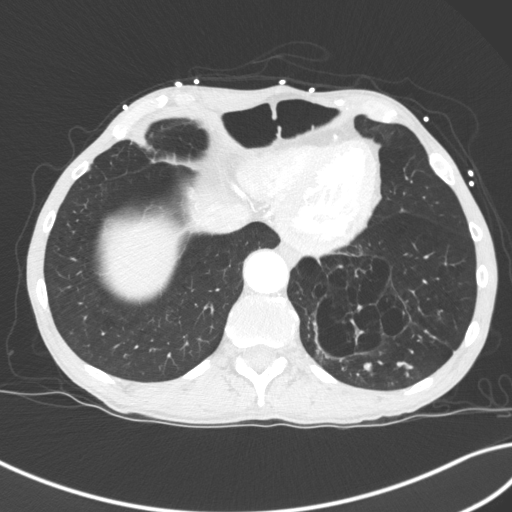
[im 57/192  lung]
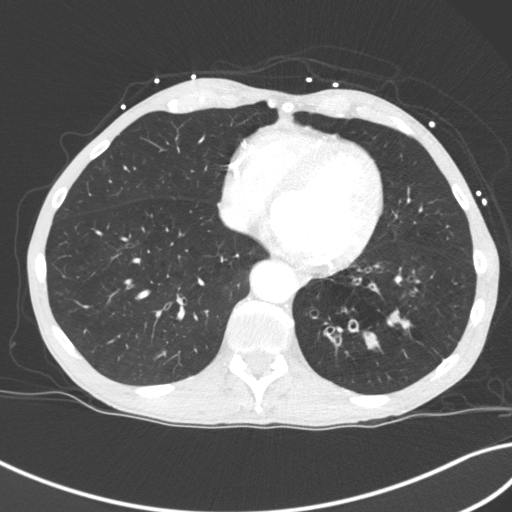
[im 71/192  mediastinal]
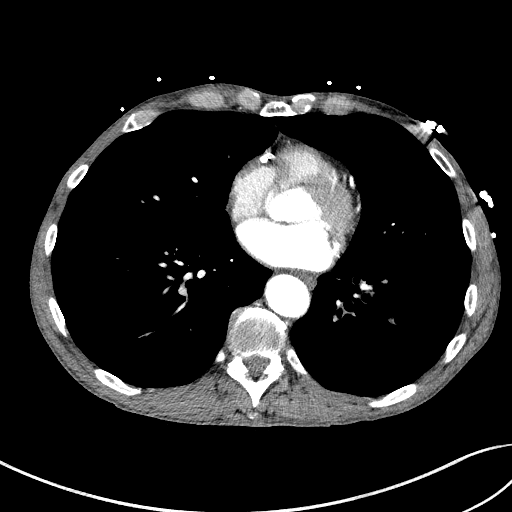
[im 71/192  lung]
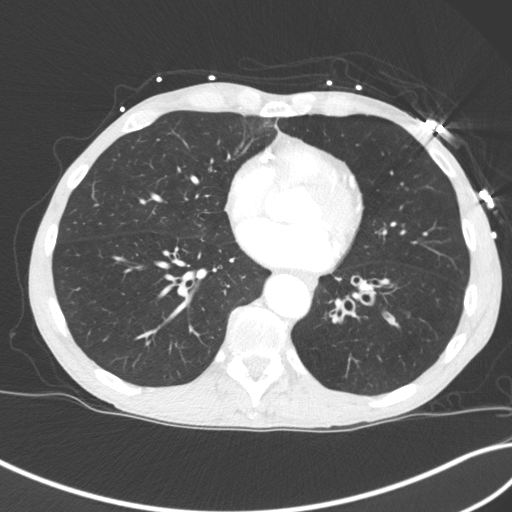
[im 85/192  lung]
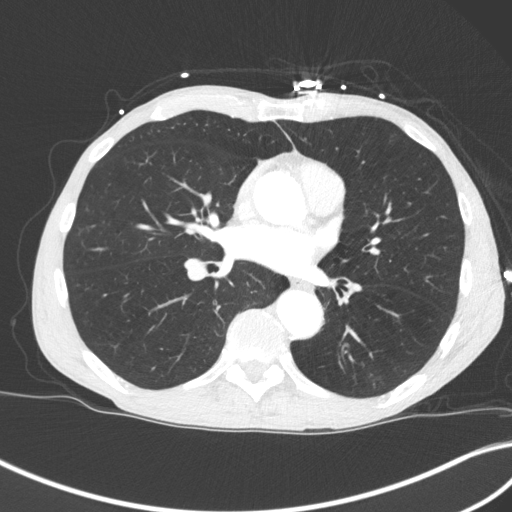
[im 107/192  lung]
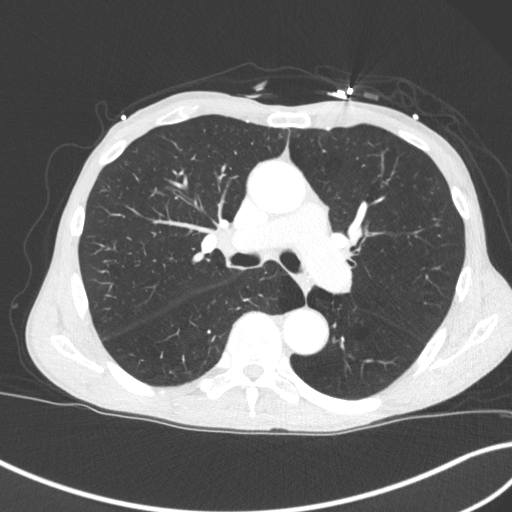
[im 121/192  lung]
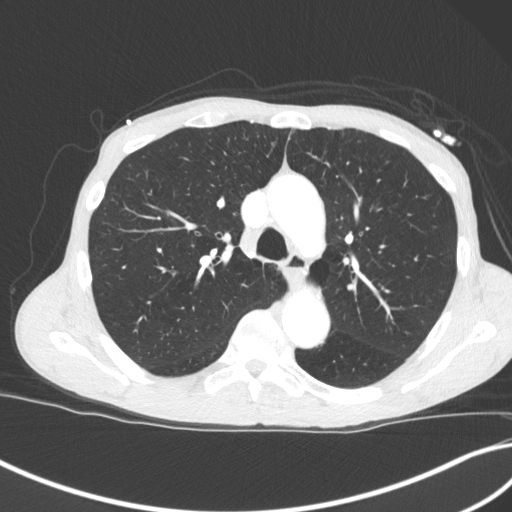
[im 135/192  mediastinal]
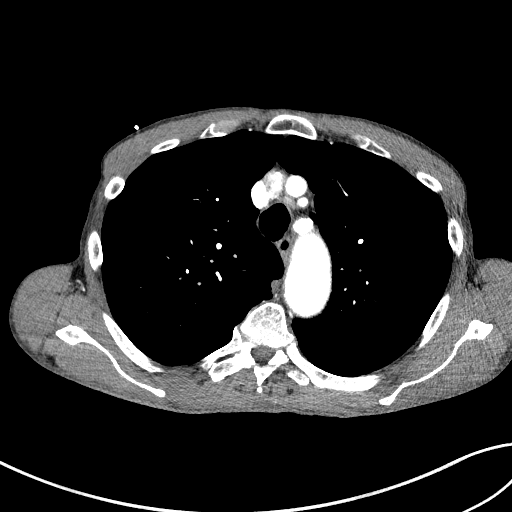
[im 135/192  lung]
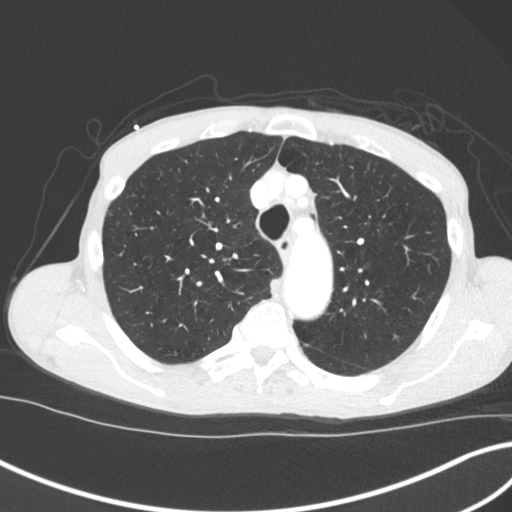
[im 149/192  lung]
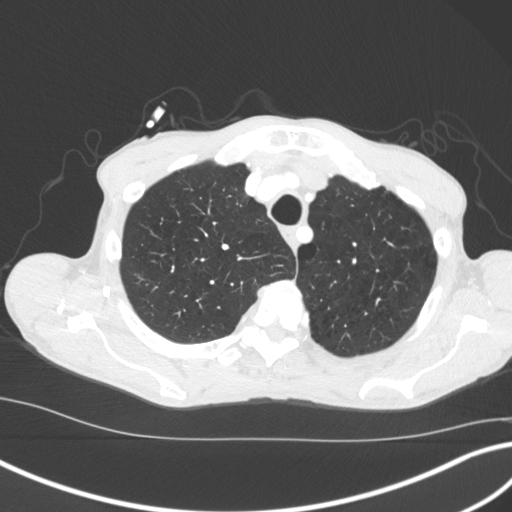
[im 163/192  lung]
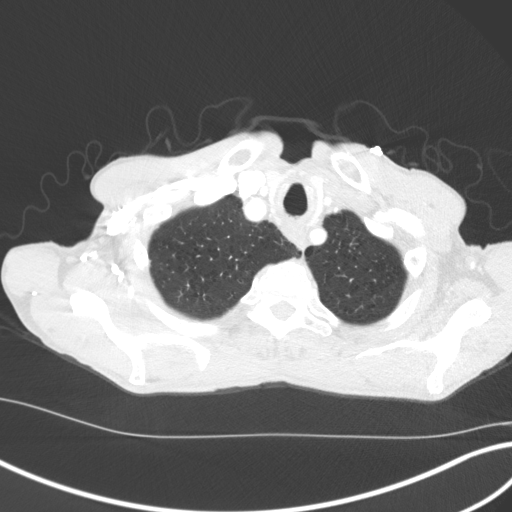
[im 177/192  lung]
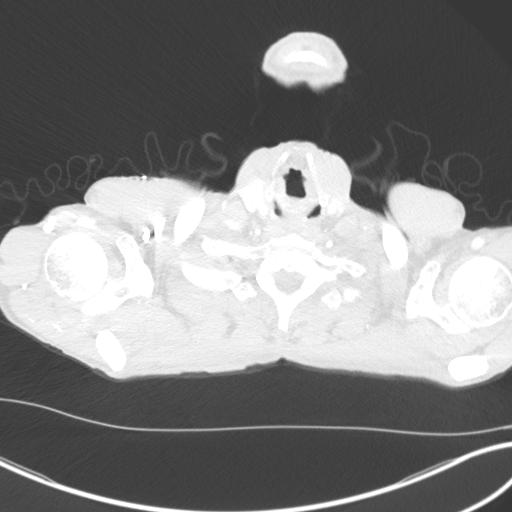

[Series 5: coronal · coronal · 0.75mm/px · 3 of 135 slices shown]
[im 27/135  lung]
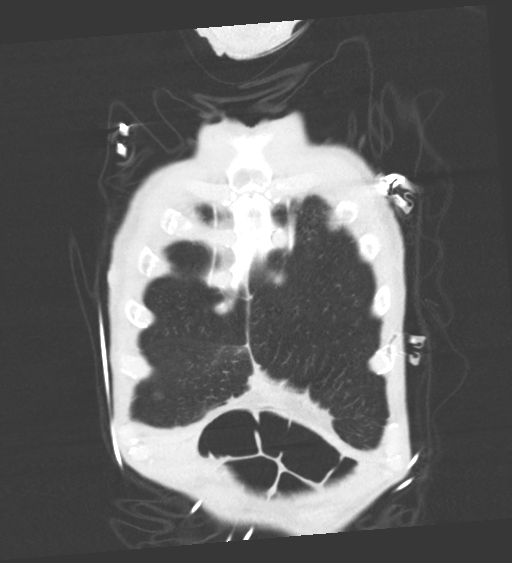
[im 54/135  lung]
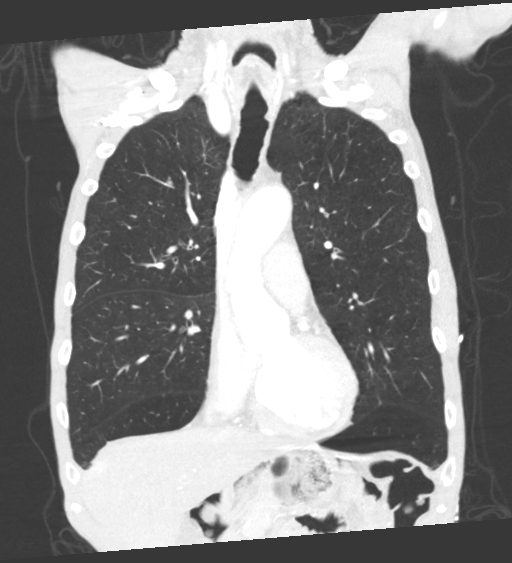
[im 81/135  lung]
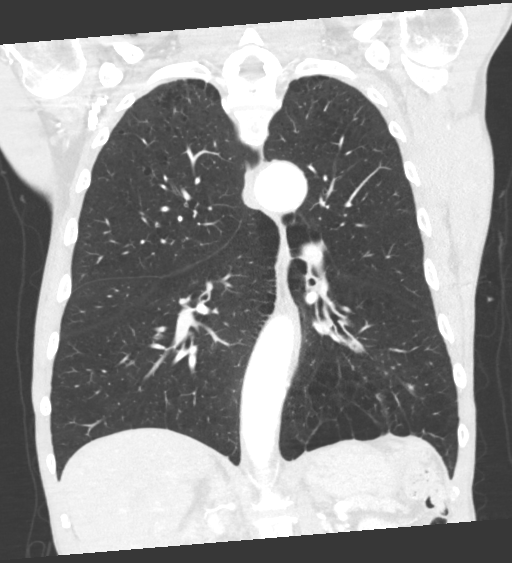

[15 of 36 positions shown; findings below may reference images not displayed]

FINDINGS: Cardiovascular: Normal heart size. No pericardial effusion.
Three-vessel coronary artery calcifications. Atherosclerotic disease
of the thoracic aorta. No suspicious filling defects of the central
pulmonary arteries.

Mediastinum/Nodes: Patulous esophagus. Thyroid is unremarkable. No
pathologically enlarged lymph nodes seen in the chest.

Lungs/Pleura: Central airways are patent. Small amount of the is
debris noted in the distal trachea. Focal left lower lobe
bronchiectasis with associated bronchial wall thickening and mucoid
impaction. Solid left lower lobe pulmonary nodule measuring 4 mm on
series 4, image 138.

Upper Abdomen: Simple cysts of the right kidney. No acute
abnormality.

Musculoskeletal: No chest wall abnormality. No acute or significant
osseous findings. Cutaneous lesion of the left upper chest measuring
1.1 cm on series 2, image 50.
IMPRESSION: 1. No evidence of left upper lobe pulmonary nodule. Nodule which is
previously seen on chest x-ray favored to be due overlying skin
lesion of the left upper chest.
2. Focal left lower lobe bronchiectasis with associated bronchial
wall thickening and mucoid impaction, findings are likely due to
chronic infection or aspiration.
3. Solid left lower lobe pulmonary nodule measuring 4 mm. No
follow-up needed if patient is low-risk. Non-contrast chest CT can
be considered in 12 months if patient is high-risk. This
recommendation follows the consensus statement: Guidelines for
Management of Incidental Pulmonary Nodules Detected on CT Images:
4. Aortic Atherosclerosis (2B217-24R.R) and Emphysema (2B217-7VK.M).
